# Patient Record
Sex: Female | Born: 1985
Health system: Midwestern US, Academic
[De-identification: ages and names within clinical notes are randomized; demographics above are authoritative.]

---

## 2014-06-24 IMAGING — CR LOW_EXM
3 series · 3 of 3 positions shown · non-contrast
Comparison: None

EXAM: Right ankle
INDICATION: Pain, twisted
TECHNIQUE: AP, oblique, lateral

[ankle ap]
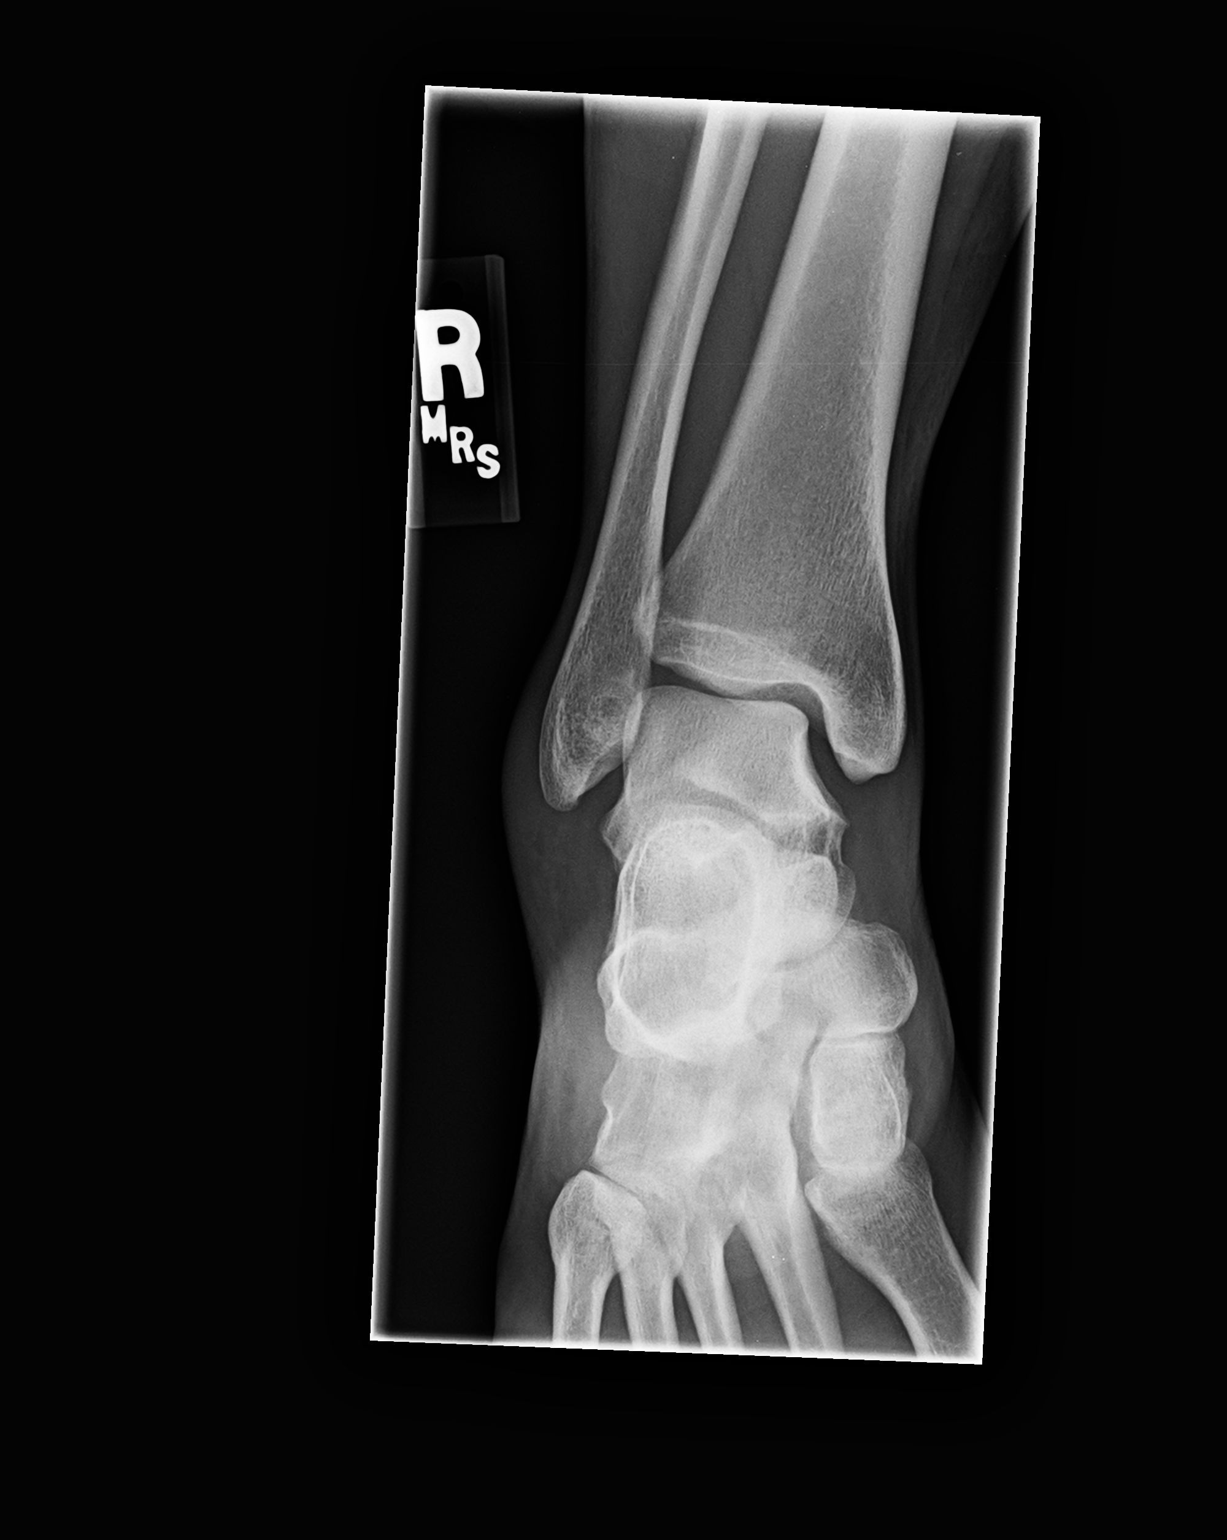

[ankle obl]
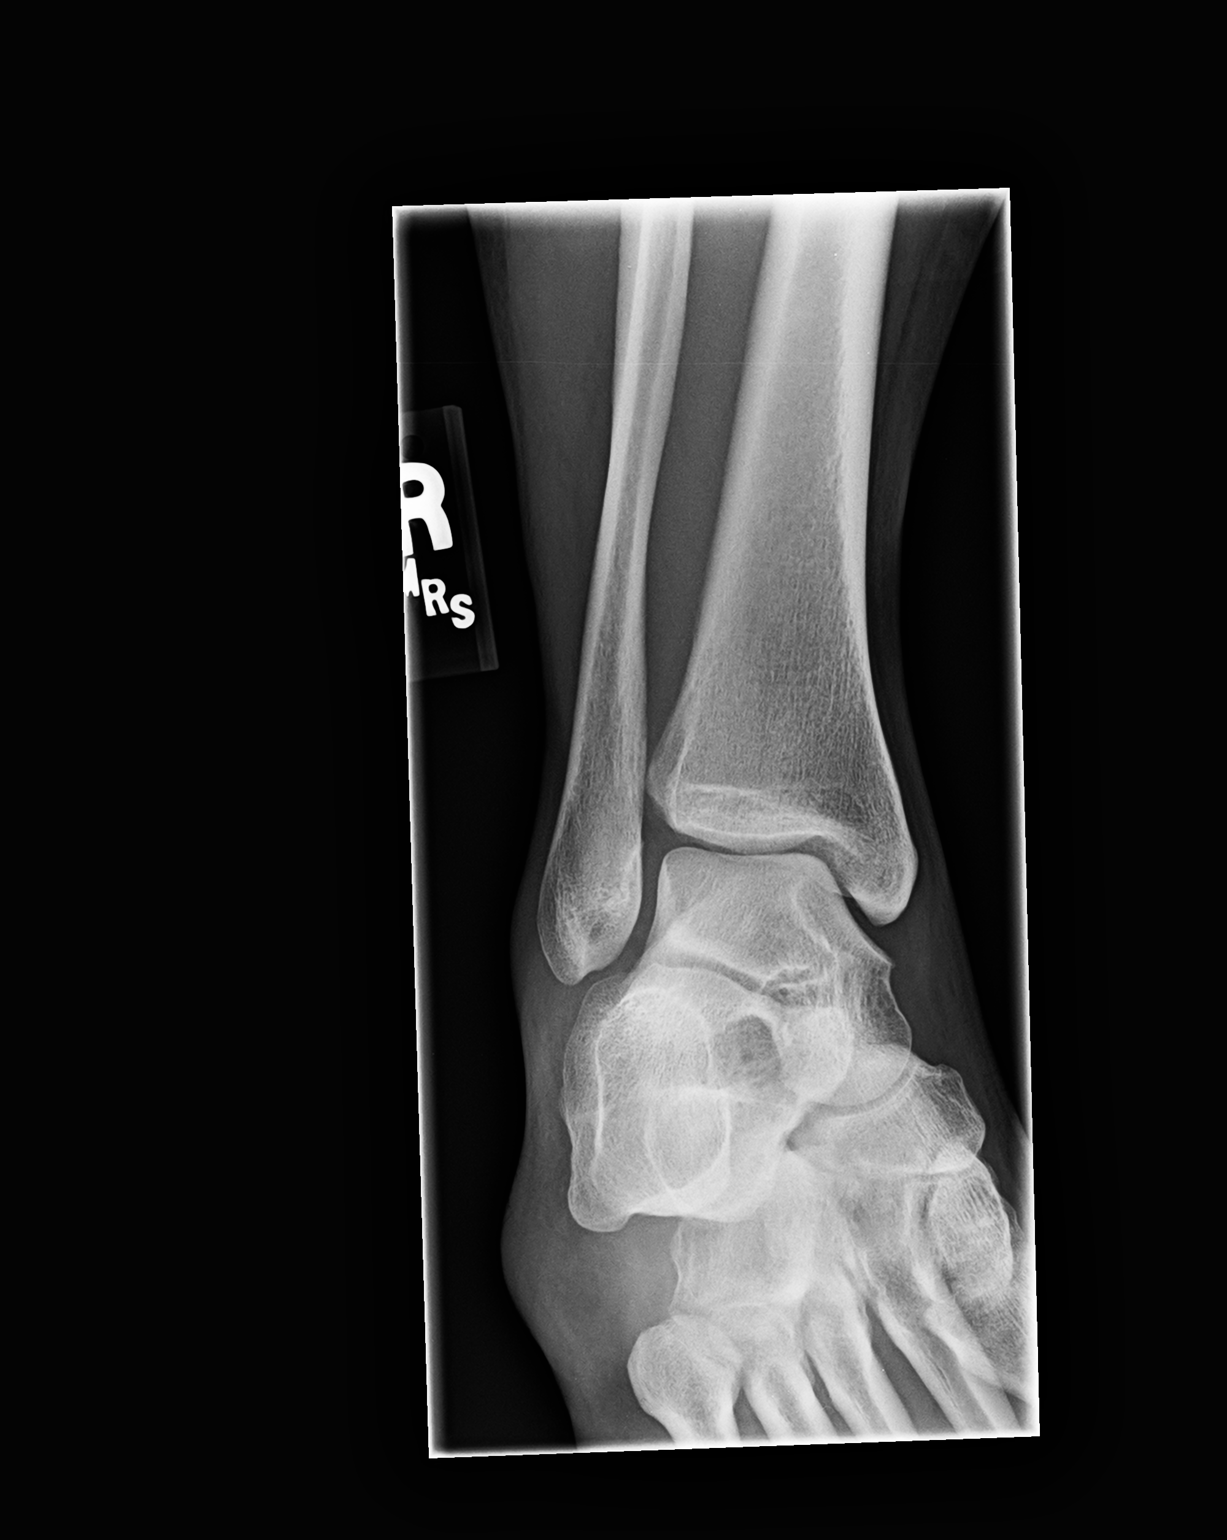

[ankle lat]
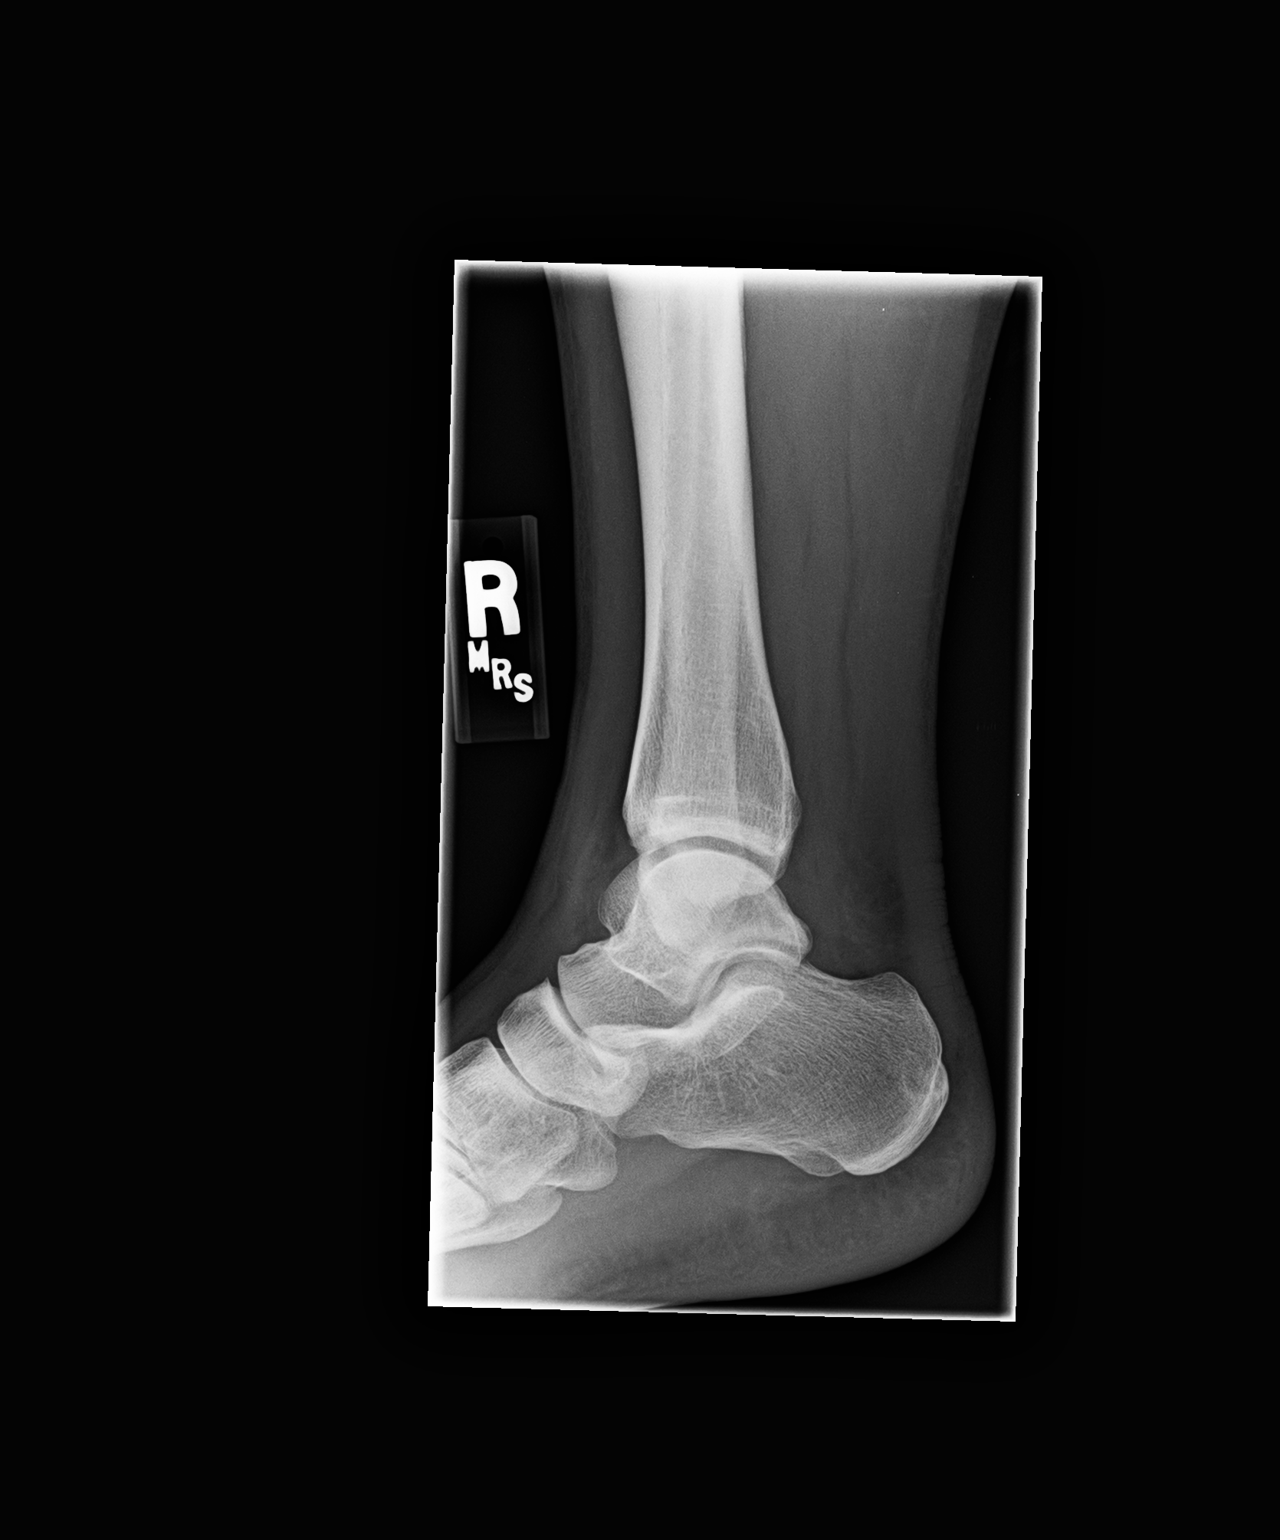

[3 of 3 positions shown; findings below may reference images not displayed]

IMPRESSION: No acute bony abnormality.
FINDINGS: The osseous structures are intact.  No fracture.  Ankle mortis is maintained.
Mild soft tissue swelling over the lateral malleolus.  No laceration or
foreign body.

Comments:  Findings called to Amazigh, Quirijn at [id number] hours.

Tech Notes: TWISTED RT ANKLE LAST NIGHT AND FELT A POP IN ANKLE. PAIN/SWELLING RT LATERAL ANKLE AND
FOOT. SHIELDED. ME

## 2014-06-24 IMAGING — CR LOW_EXM
3 series · 3 of 3 positions shown · non-contrast
Comparison: None

EXAM: Right foot
INDICATION: Pain, twist
TECHNIQUE: AP, oblique, lateral

[foot]
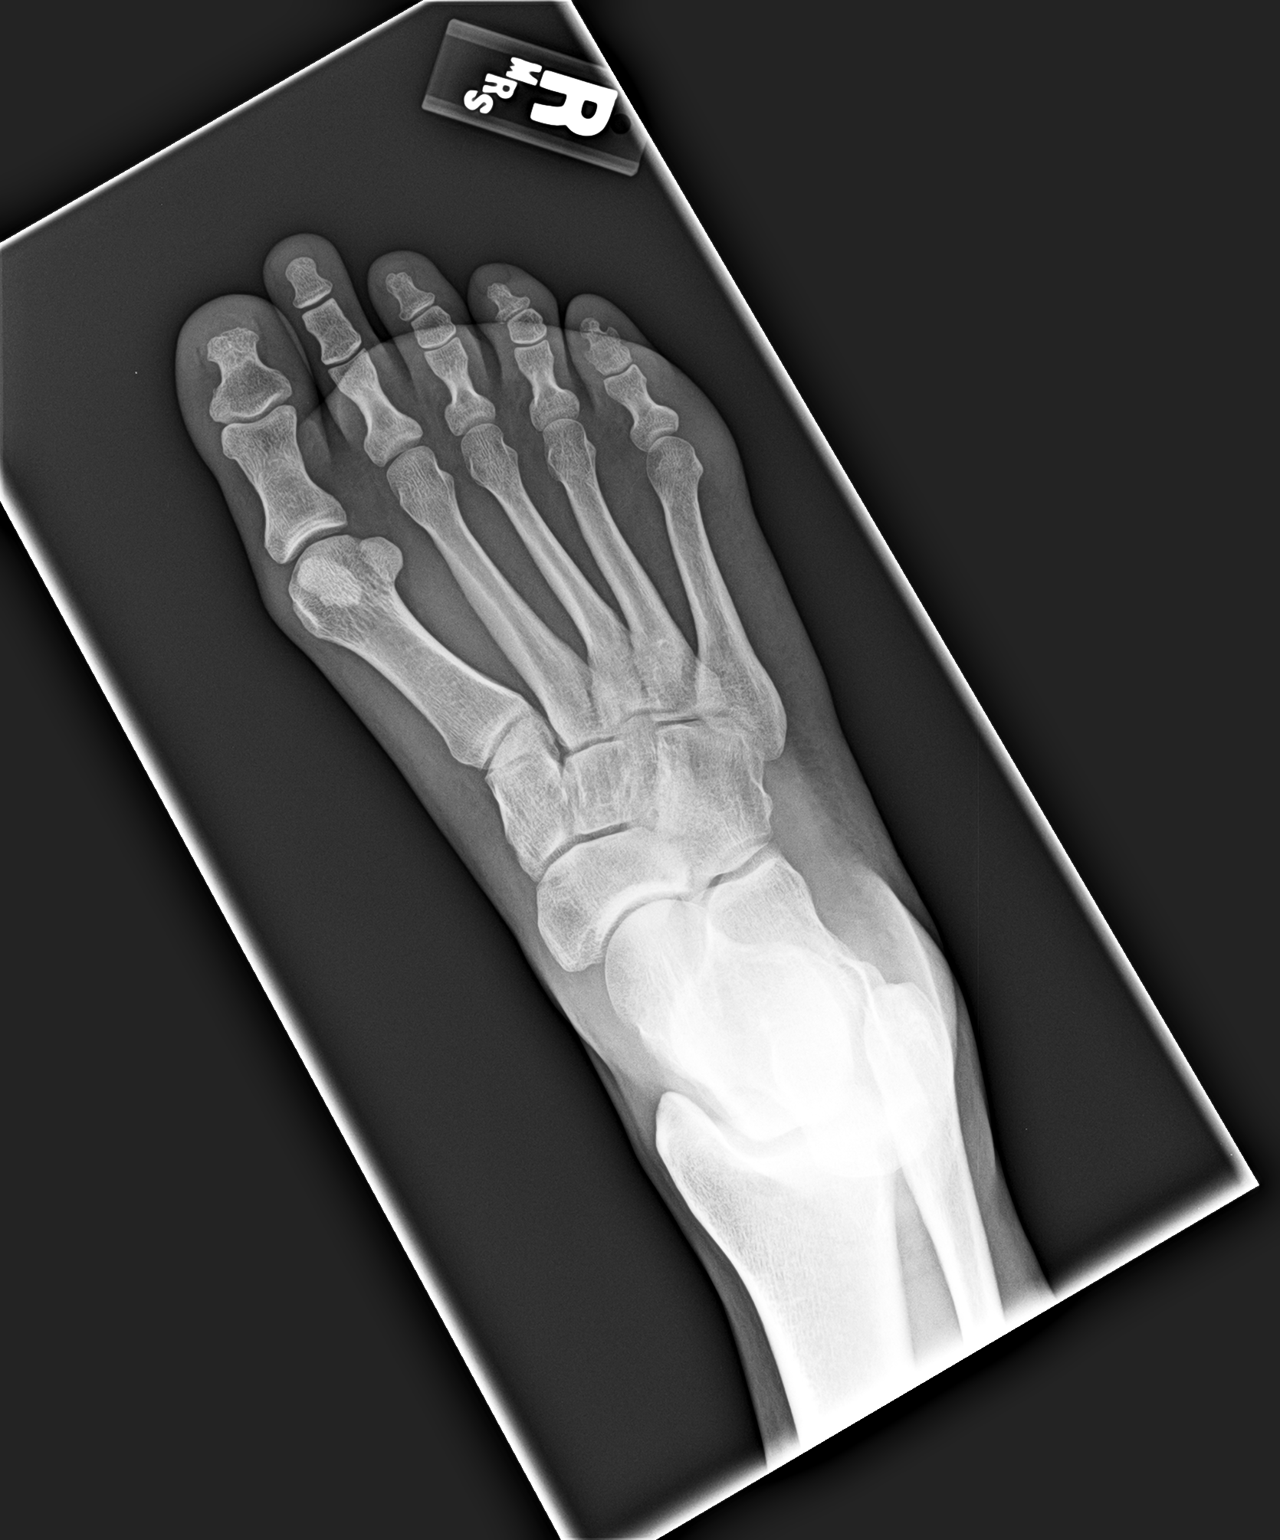

[foot obl]
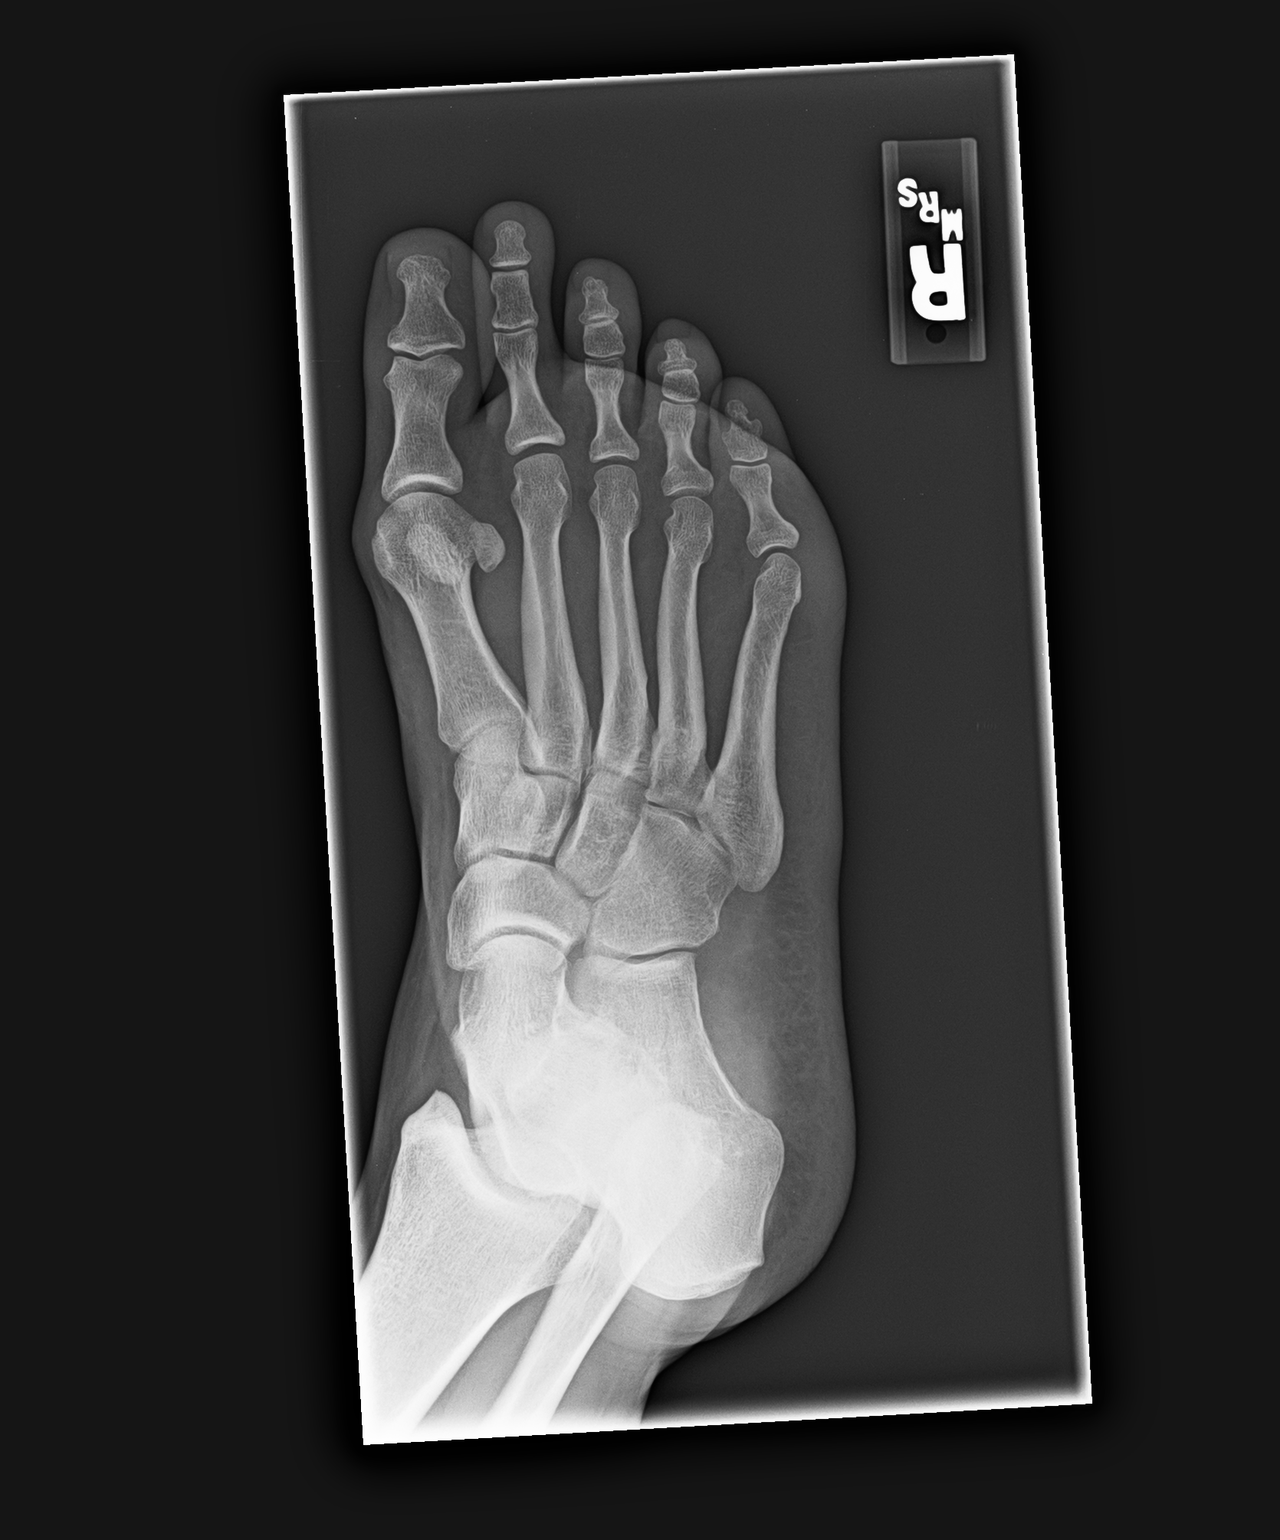

[foot lat]
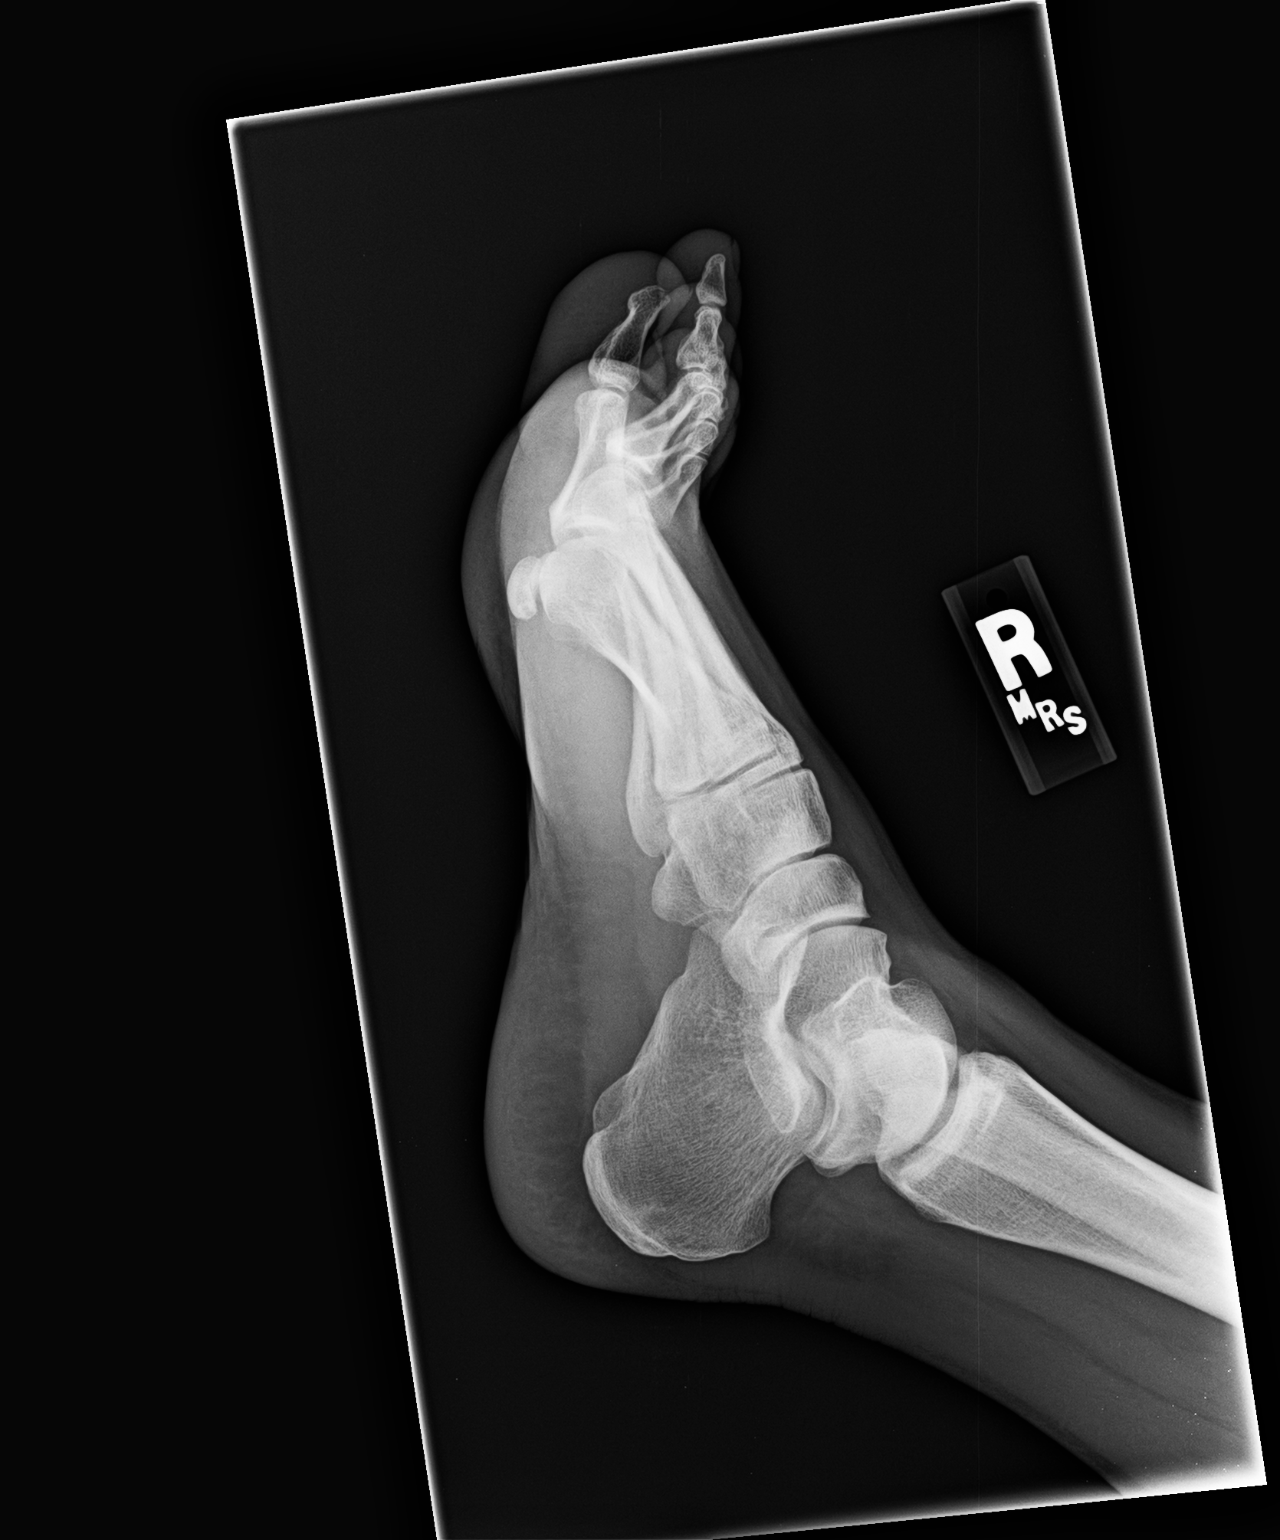

[3 of 3 positions shown; findings below may reference images not displayed]

IMPRESSION: No acute bony abnormality.
FINDINGS: There is normal alignment of the tarsals and metatarsals.  The joint spaces
are maintained.  Soft tissues unremarkable.  No laceration or foreign body.

Comments:  Findings called to Nikita, Rakik in the ER at 2500 hours.

Tech Notes: TWISTED RT ANKLE LAST NIGHT AND FELT A POP IN ANKLE. PAIN/SWELLING RT LATERAL ANKLE AND
FOOT. SHIELDED. ME

## 2017-06-15 IMAGING — CR NECK
5 series · 5 of 5 positions shown · non-contrast
Comparison: none

[c-spine lat (1 of 2)]
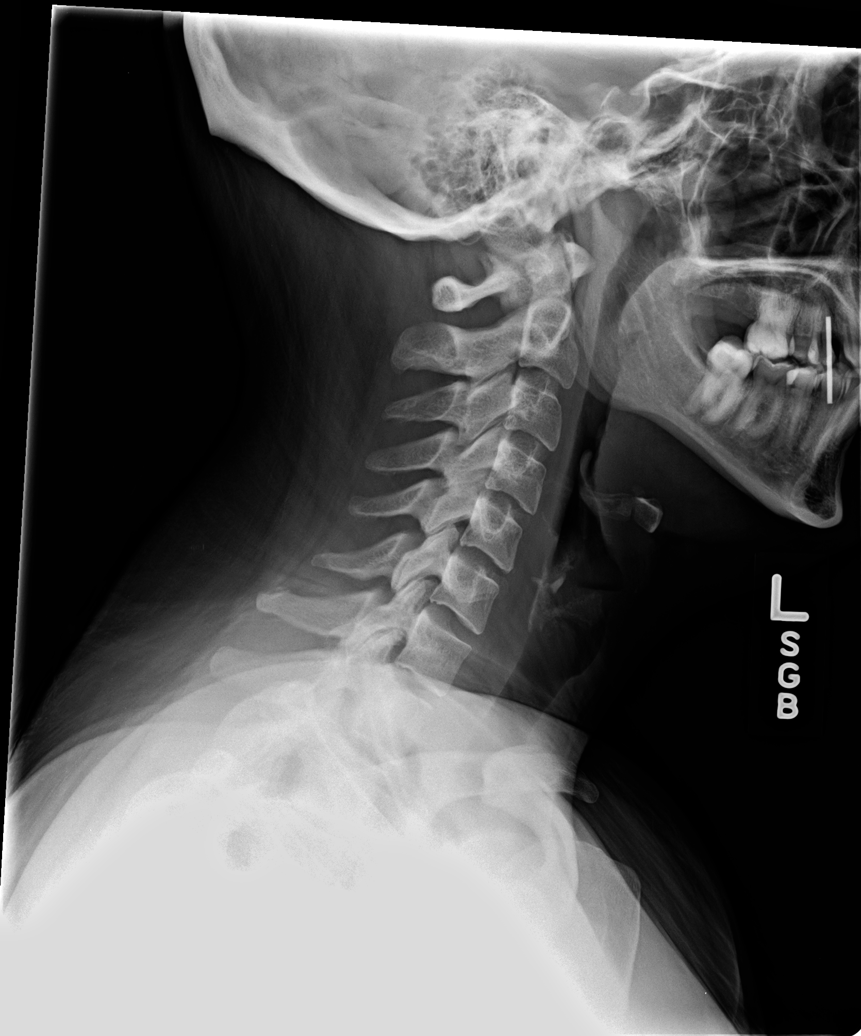

[c-spine ap]
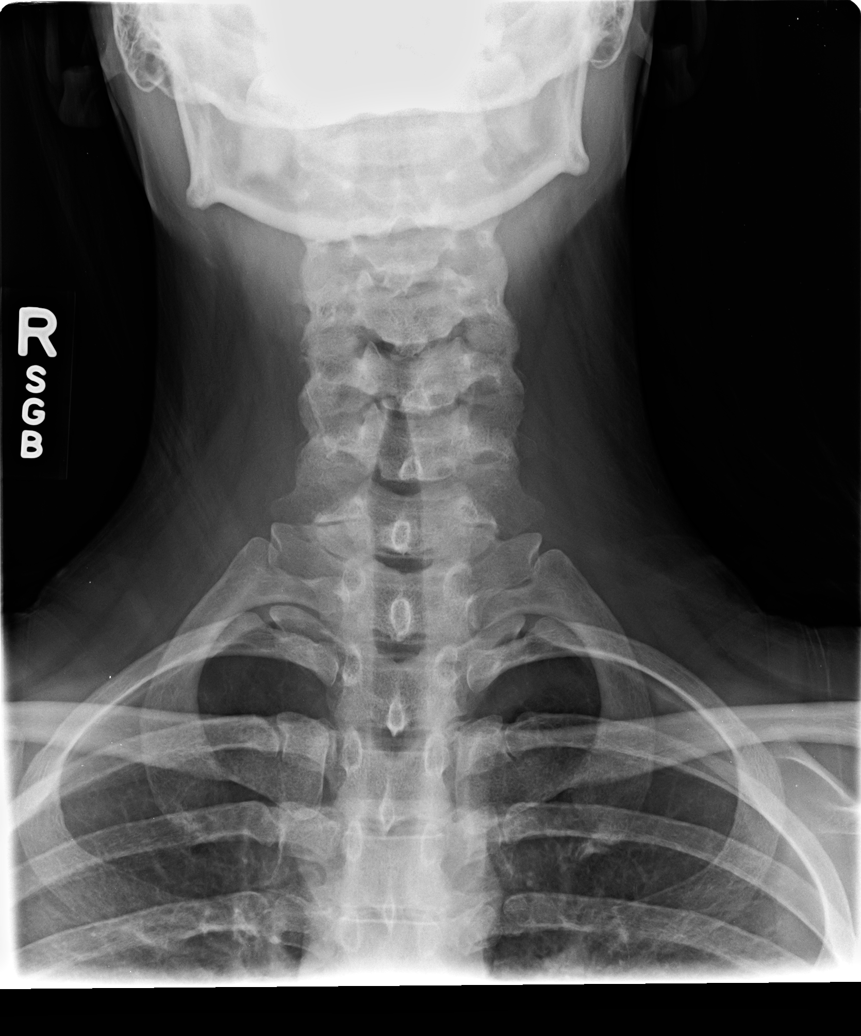

[c-spine obl]
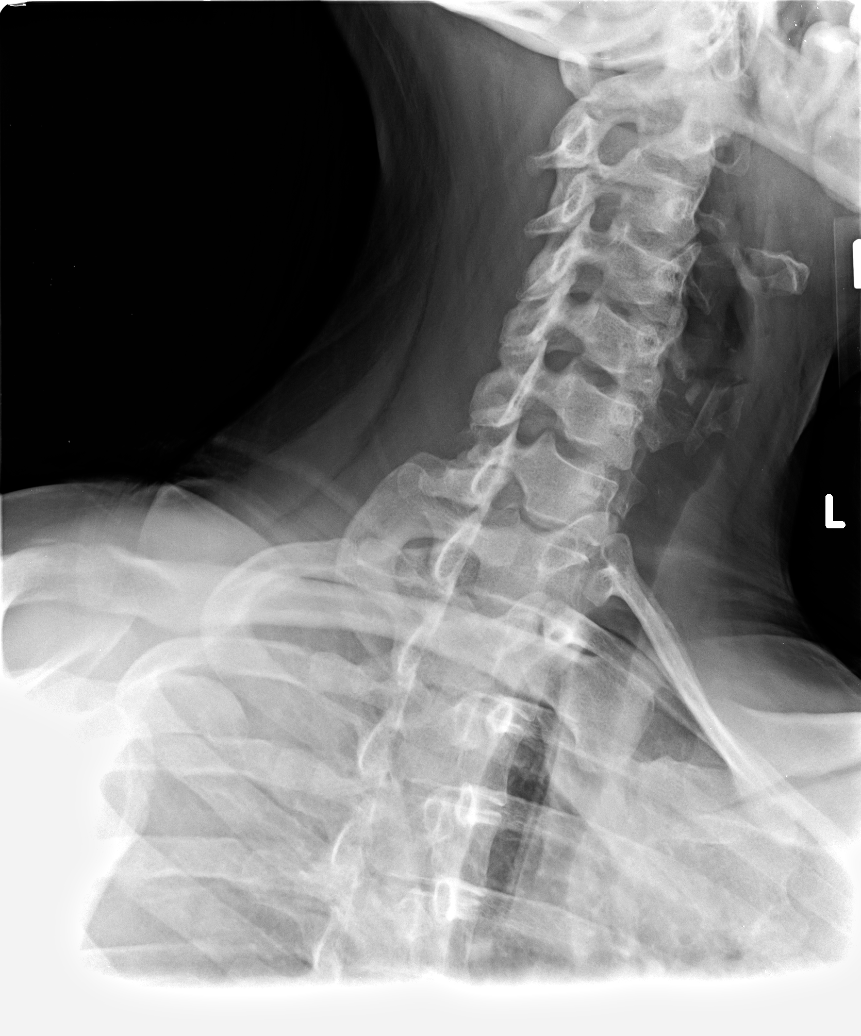

[c-spine lat (2 of 2)]
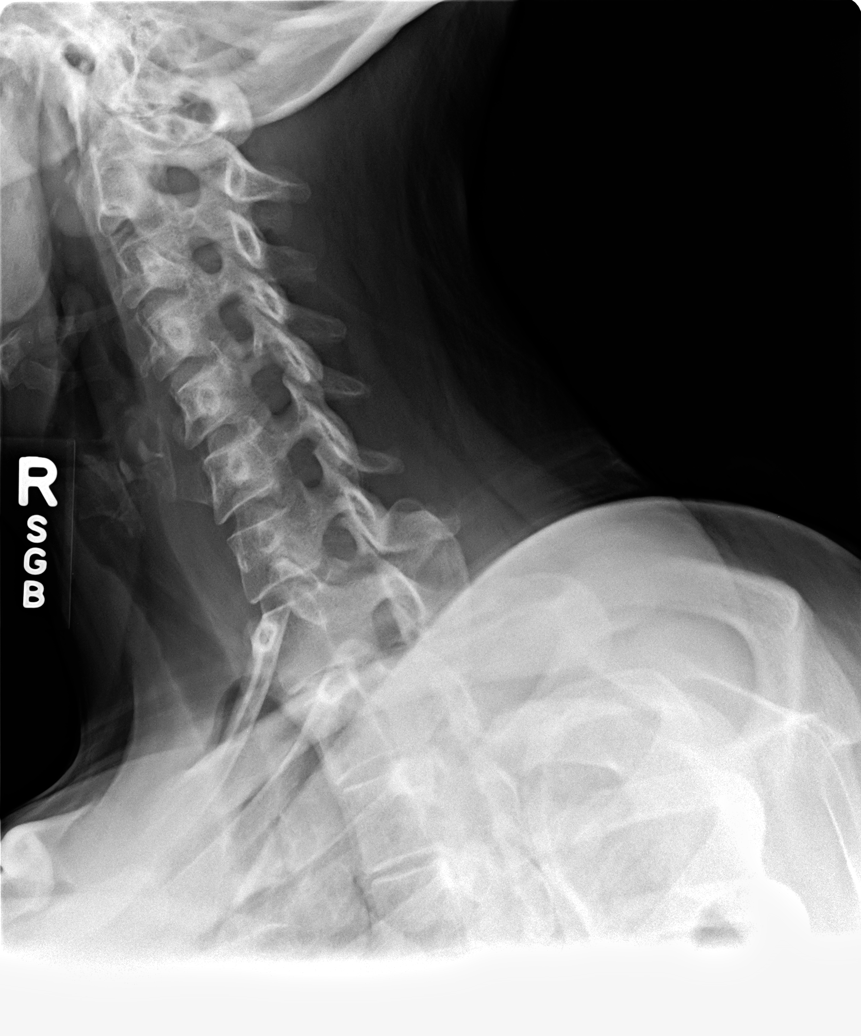

[odontoid]
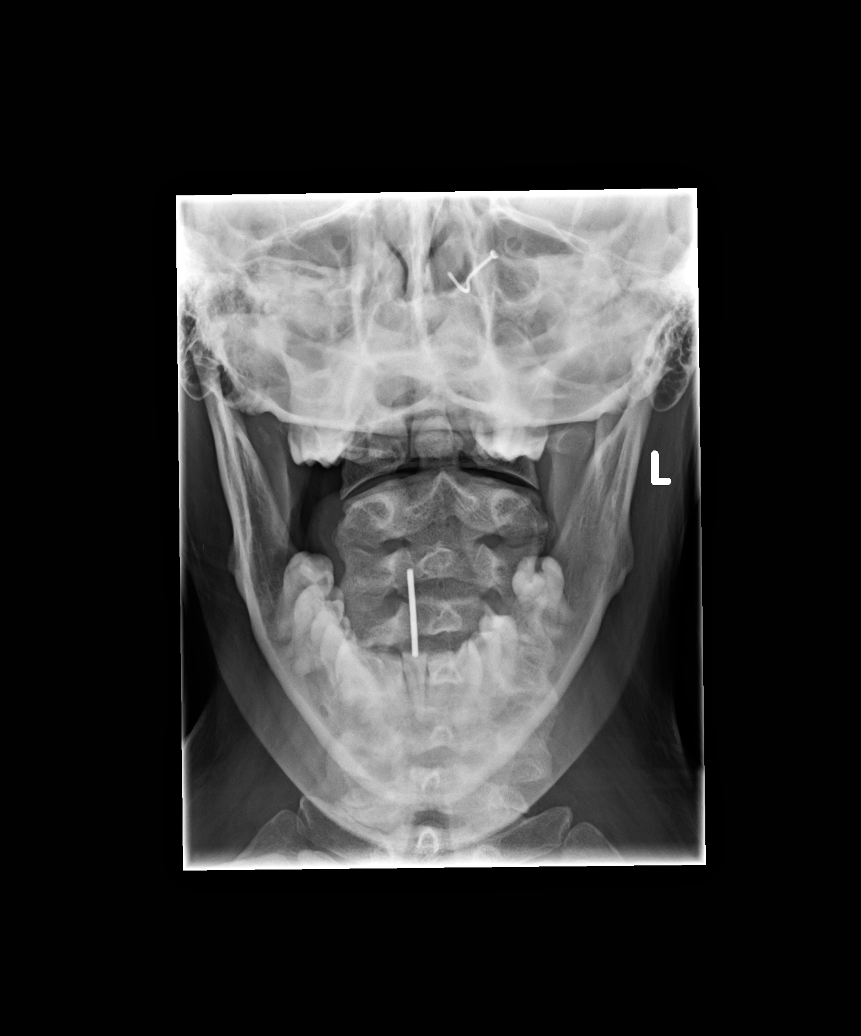

[5 of 5 positions shown; findings below may reference images not displayed]

DIAGNOSTIC STUDIES

EXAM

Cervical spine series.

INDICATION

RT ARM PAIN AND NUMBNESS, NECK PAIN
PT STATES NECK PAIN THAT RADIATES INTO RT ARM W/ NO KNOWN CAUSE. PAIN
RADIATING DOWN RT ARM IS SEVERE ENOUGHT THAT PT STATES IT IS CAUSING SLIGHT
DECREASE ROM. DENIED PREGNANCY. SHIELDED. SB/MT

TECHNIQUE

Lateral, AP, oblique, and odontoid cervical spine views.

COMPARISONS

None.

FINDINGS

The vertebral body height and alignment is maintained. There is no fracture. No evidence of neural
foraminal stenosis on oblique projections. The atlantoaxial alignment is normal. Tongue piercing is
incidentally noted.

IMPRESSION

No acute osseous abnormality or significant disc degeneration.

## 2017-06-15 IMAGING — CR UP_EXM
4 series · 4 of 4 positions shown · non-contrast
Comparison: none

[elbow ap]
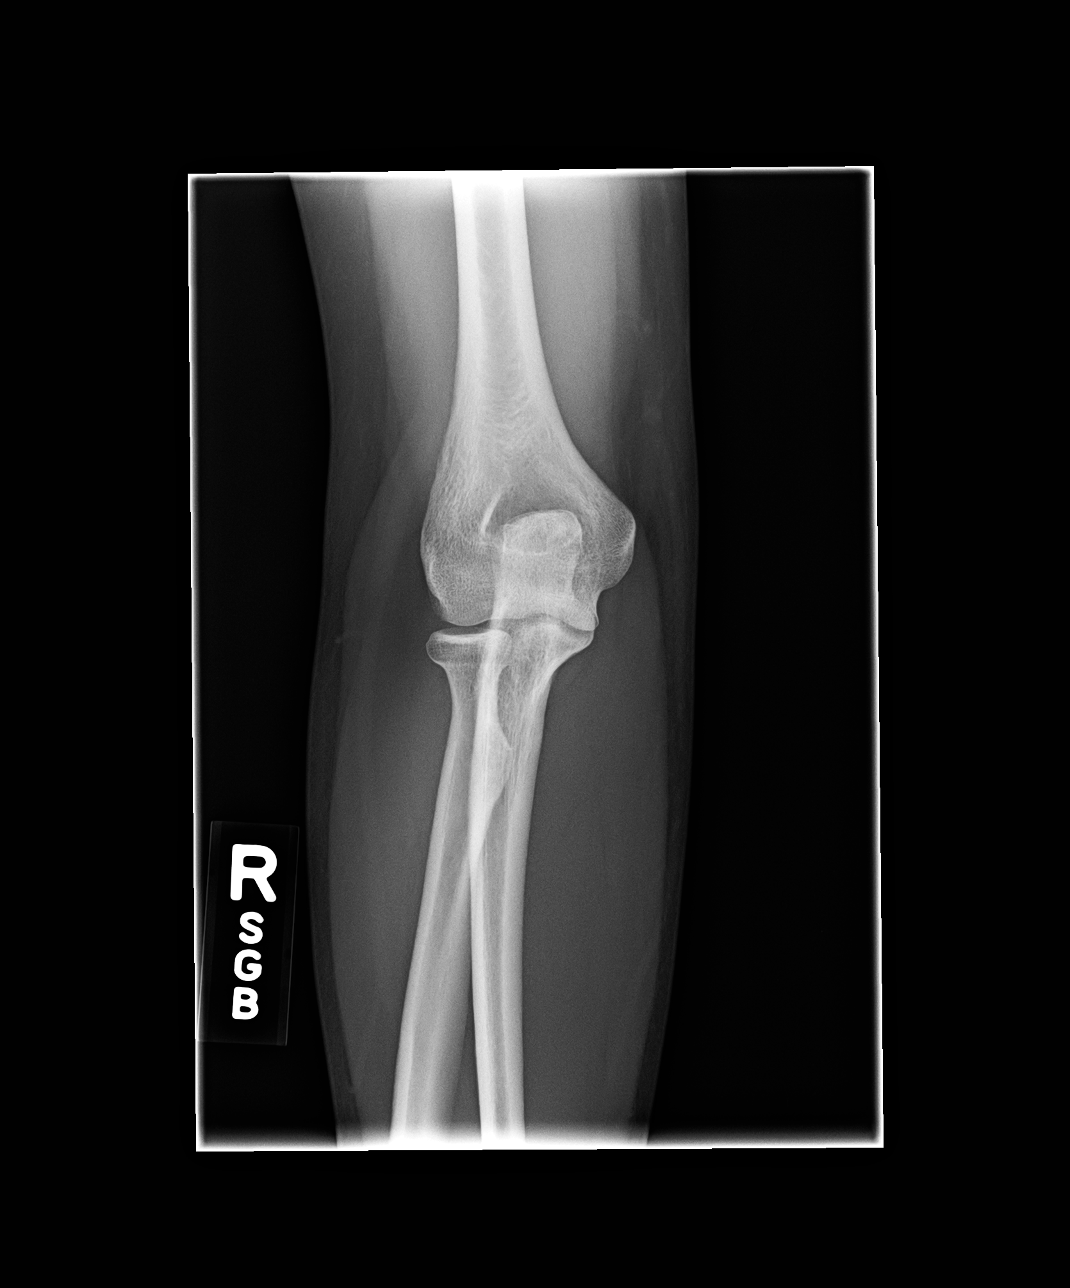

[elbow obl (1 of 2)]
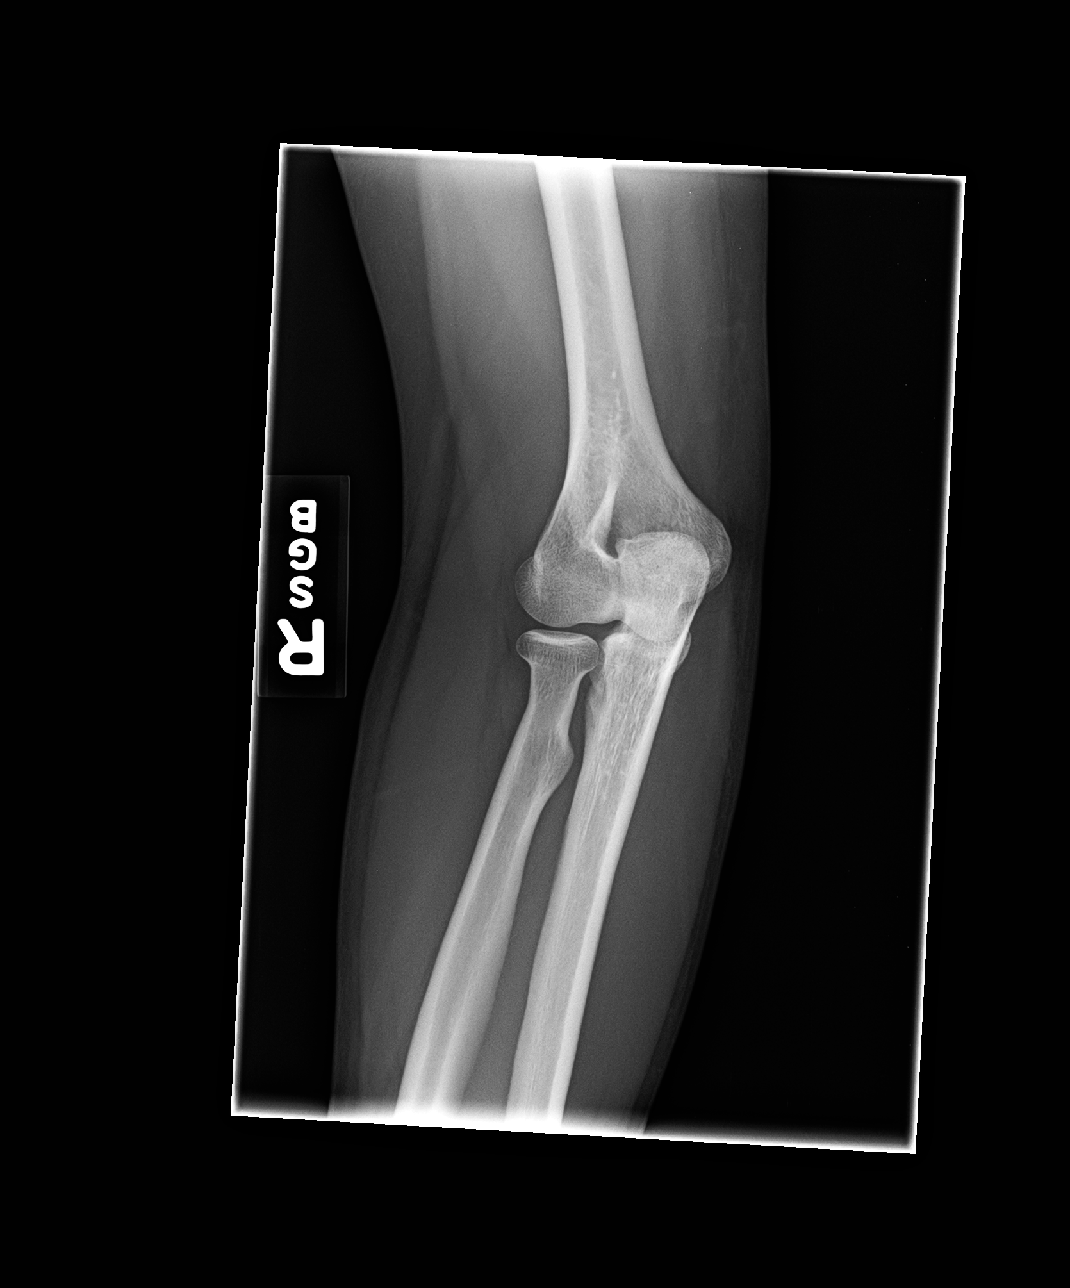

[elbow obl (2 of 2)]
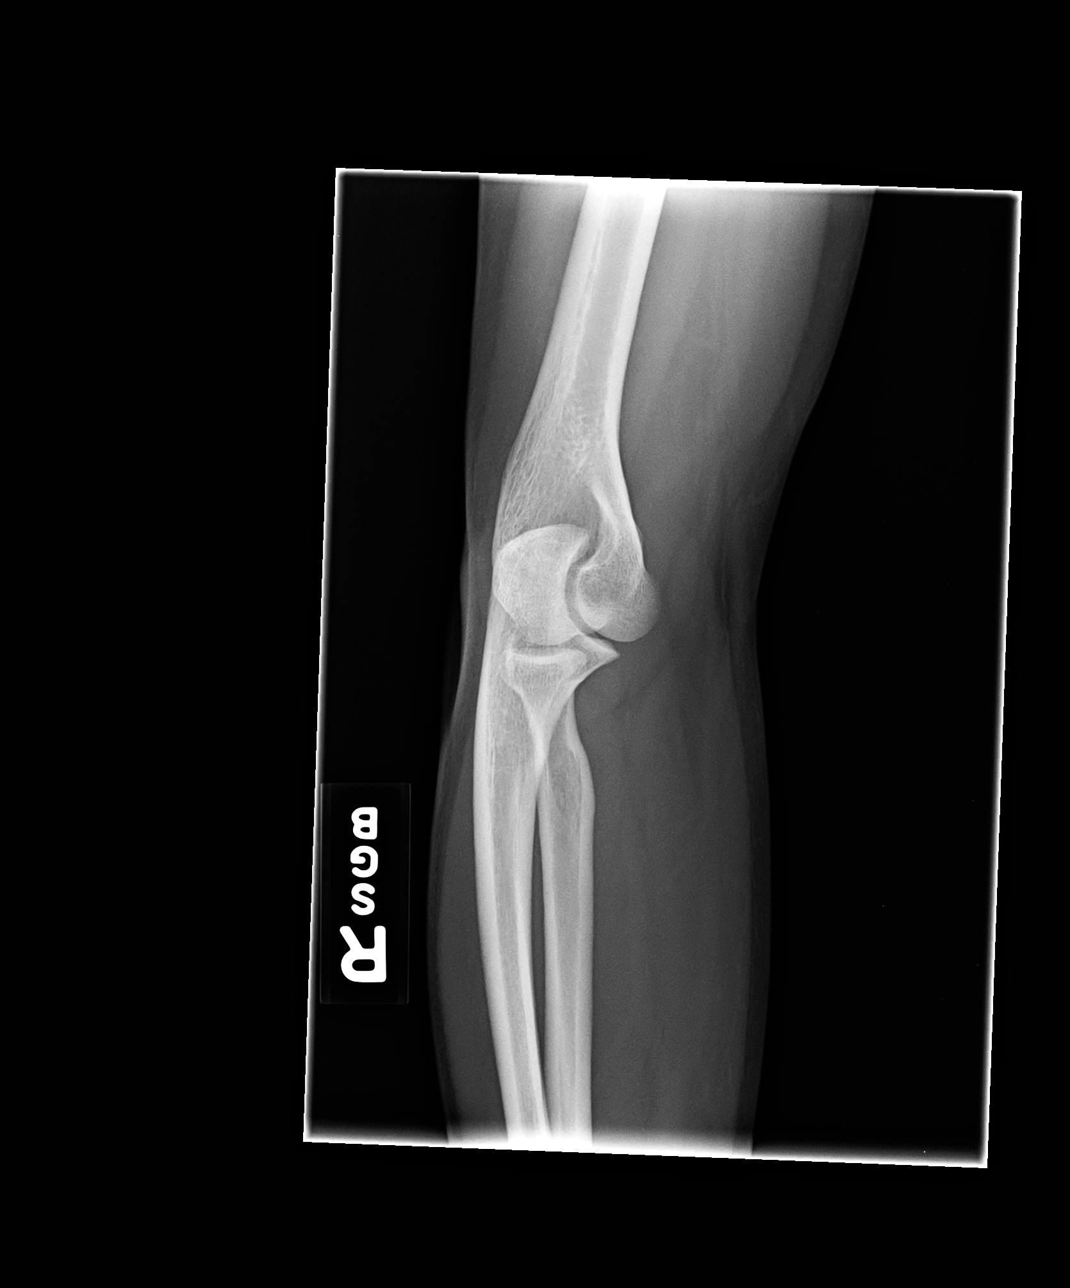

[elbow lat]
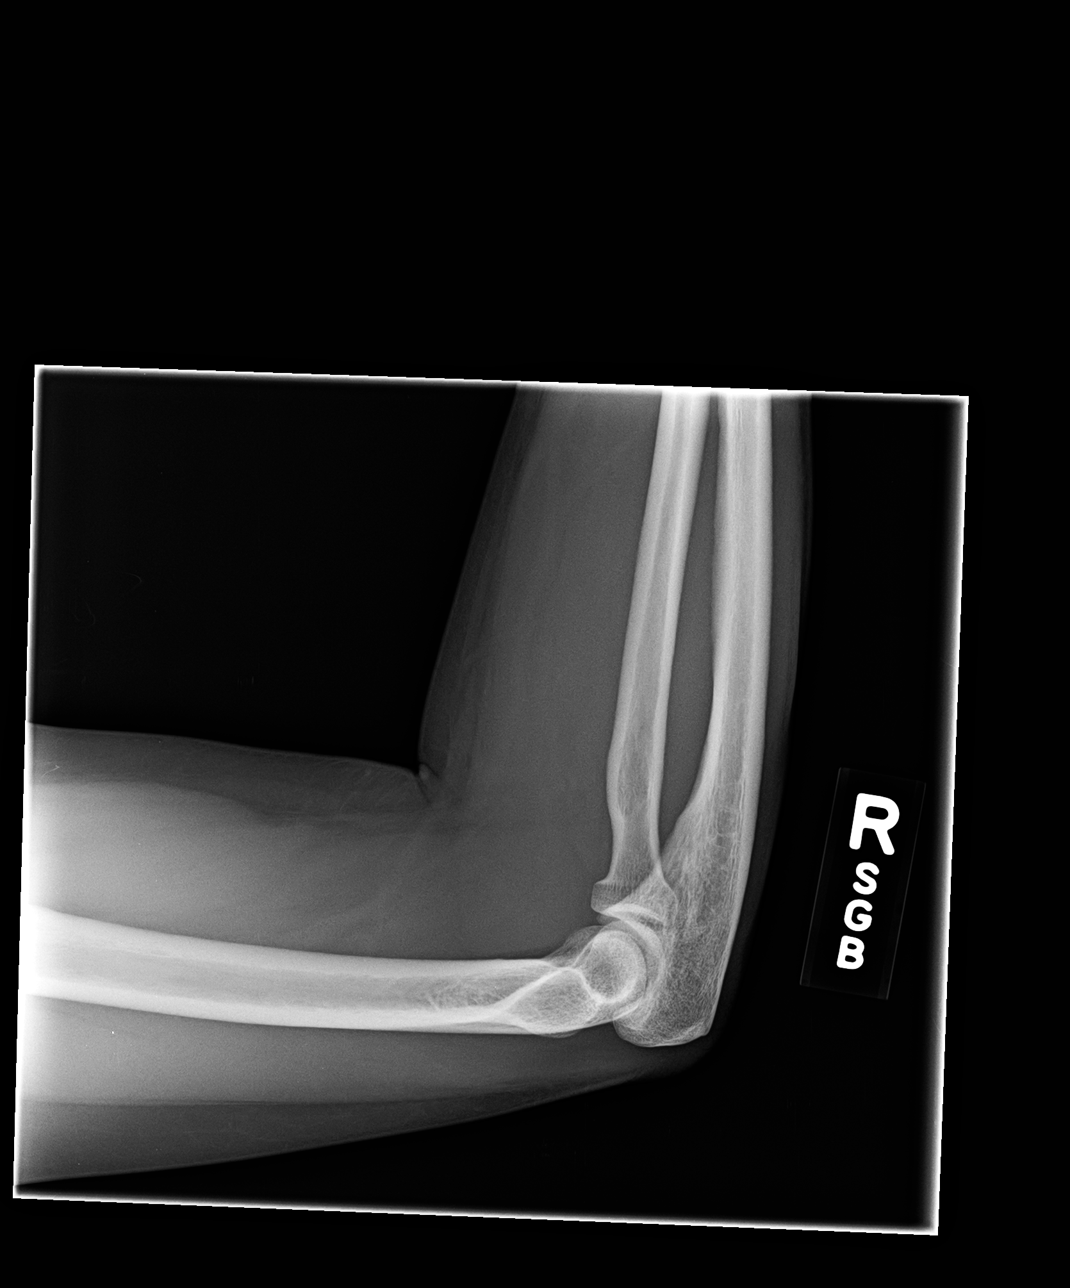

[4 of 4 positions shown; findings below may reference images not displayed]

DIAGNOSTIC STUDIES

EXAM

4 VIEW RIGHT ELBOW

INDICATION

Right elbow pain.

TECHNIQUE

4 views of the right elbow were submitted.

COMPARISONS

None

FINDINGS

There is no elbow joint effusion. No displaced fracture or malalignment is detected. There is no
joint space loss. No destructive bone lesion is identified.

IMPRESSION

No acute bony abnormality and no joint effusion.

## 2017-06-17 IMAGING — CT Spine^1_C_SPINE (Adult)
1 series · 12 of 14 positions shown, 15 images · non-contrast
Comparison: none

[Series 5: c-spine 1.5 bone · axial · 0.33mm/px · z∈[+98,+241]mm · 12 of 112 slices shown, 15 images]
[im 9/112  soft-tissue]
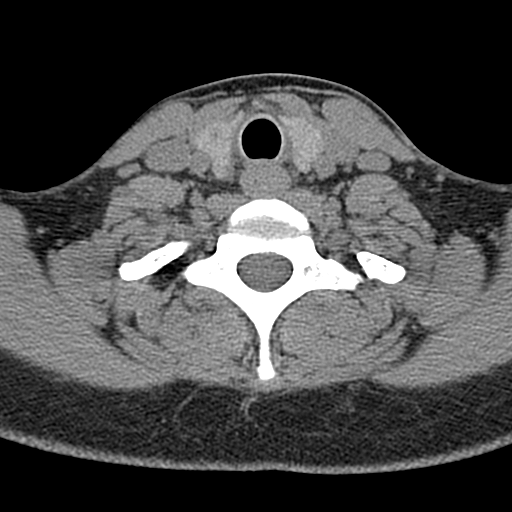
[im 9/112  bone]
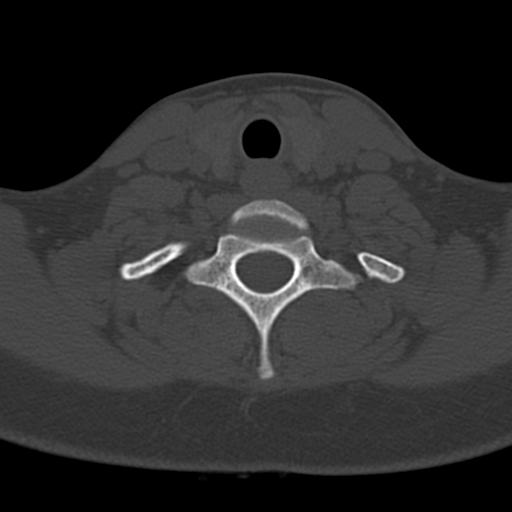
[im 18/112  bone]
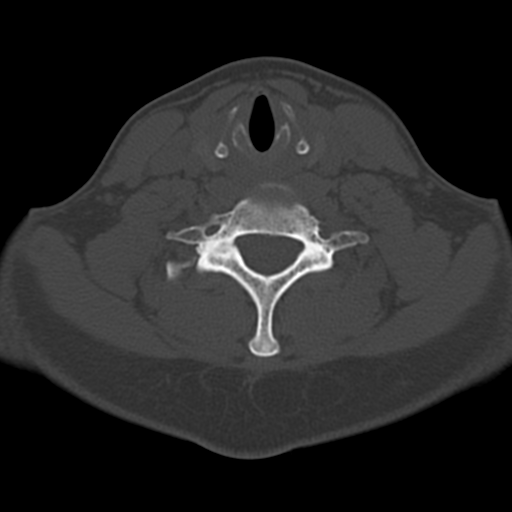
[im 26/112  bone]
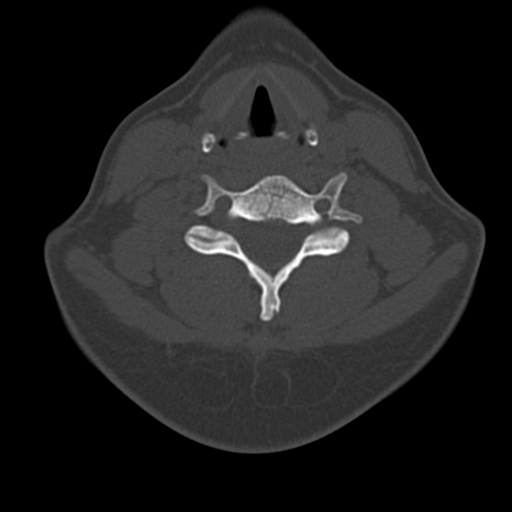
[im 35/112  bone]
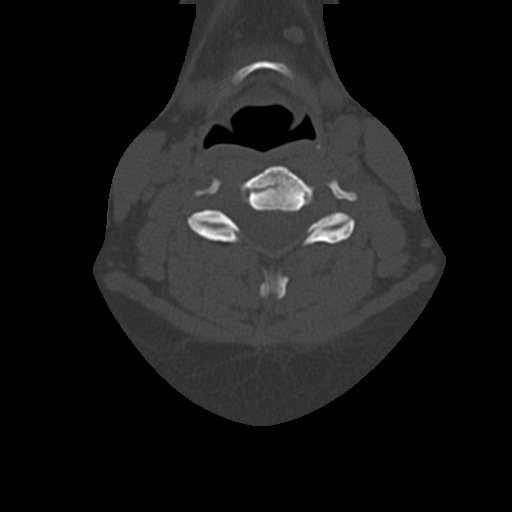
[im 43/112  soft-tissue]
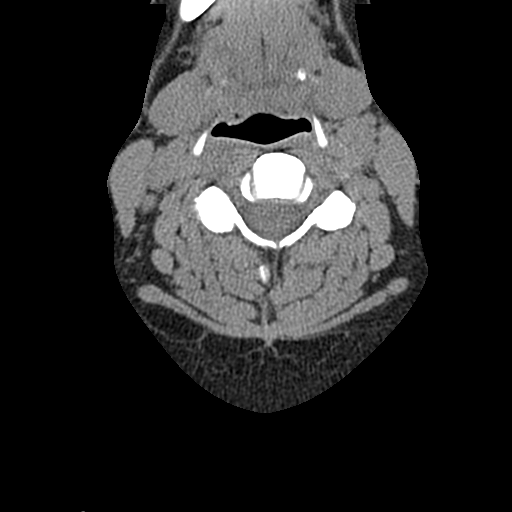
[im 43/112  bone]
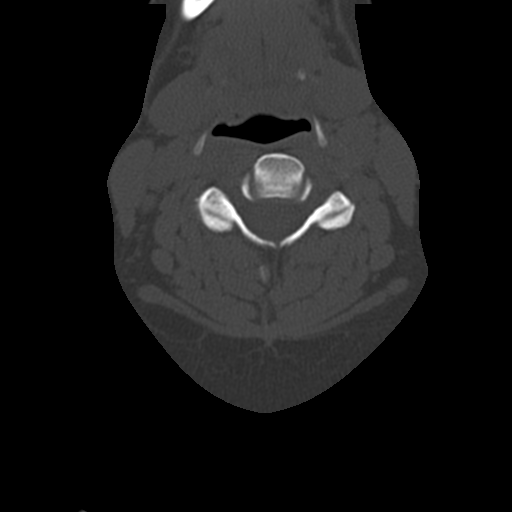
[im 52/112  bone]
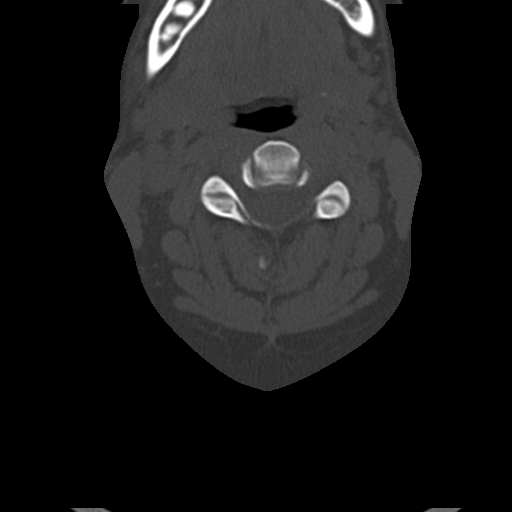
[im 60/112  bone]
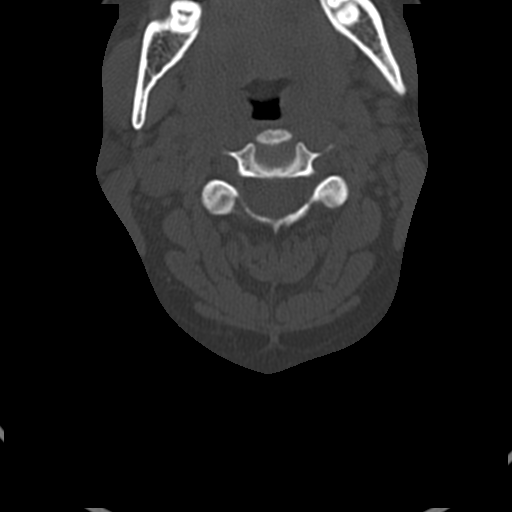
[im 69/112  bone]
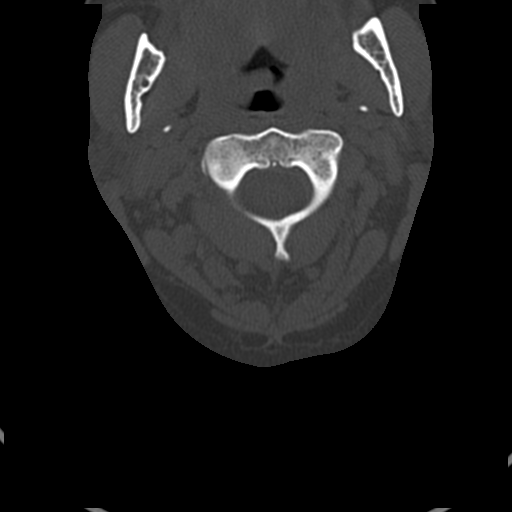
[im 77/112  soft-tissue]
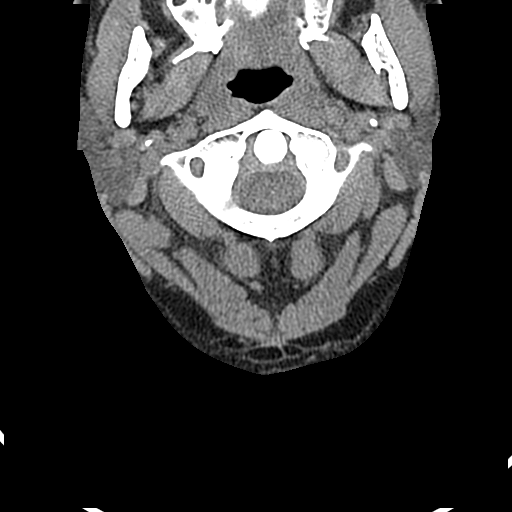
[im 77/112  bone]
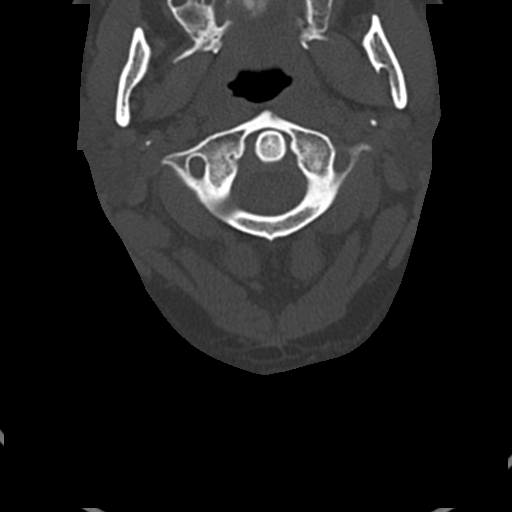
[im 86/112  bone]
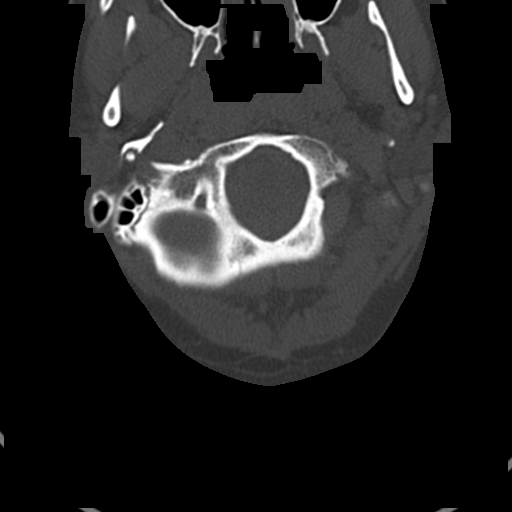
[im 94/112  bone]
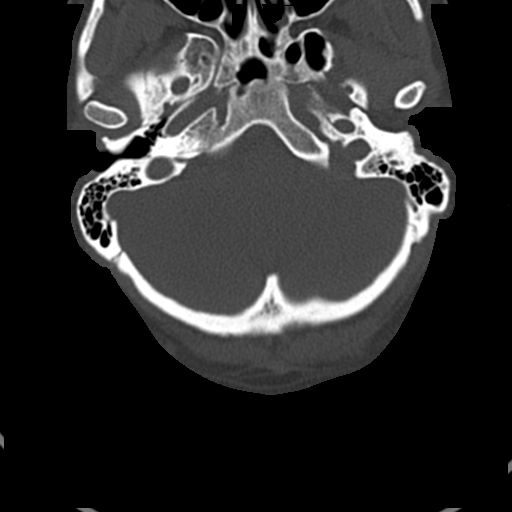
[im 103/112  bone]
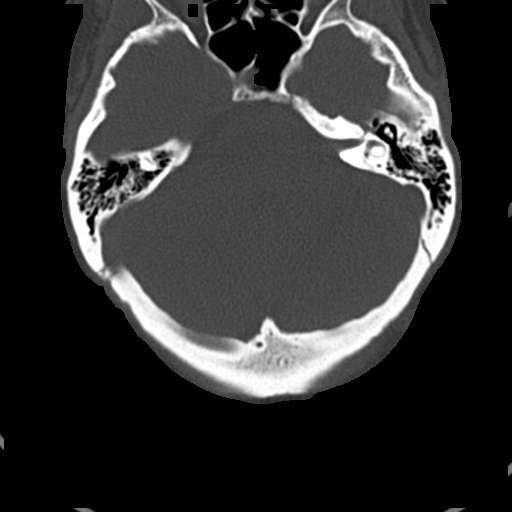

[12 of 14 positions shown; findings below may reference images not displayed]

EXAM

COMPUTED TOMOGRAPHY, CERVICAL SPINE; WITHOUT CONTRAST MATERIAL CPT 09794

INDICATION

severe pain right arm
PT STATES SEVERE NECK PAIN THAT RADIATES INTO RT ARM. NO KNOWN CAUSE.
DENIED PREGNANY. SB

TECHNIQUE

Non contrast CT of the cervical spine was performed. Multiplanar sagittal and coronal reformations
are provided. All CT scans at this facility use dose modulation, iterative reconstruction, and/or
weight based dosing when appropriate to reduce radiation dose to as low as reasonably achievable.

COMPARISONS

None

FINDINGS

No evidence for acute fracture or subluxation of the cervical spine. However, there is a small
cortical avulsion injury at the superior aspect of the tip of the C7 spinous process with
corticated margins compatible with old injury. Atlanto-dens interval is intact. Prevertebral soft
tissues are normal. AP and lateral alignment is maintained. Vertebral body heights are preserved.
Facets are aligned. No bony impingement on the central canal. No significant spondylosis or facet.

Review of neck soft tissues shows no masses or adenopathy. Paravertebral soft tissues are intact.
Airway is patent. Lung apices are unremarkable.

IMPRESSION

No evidence for acute traumatic injury to the cervical spine.

## 2018-03-09 IMAGING — CR LOW_EXM
3 series · 3 of 3 positions shown · non-contrast
Comparison: none

[foot]
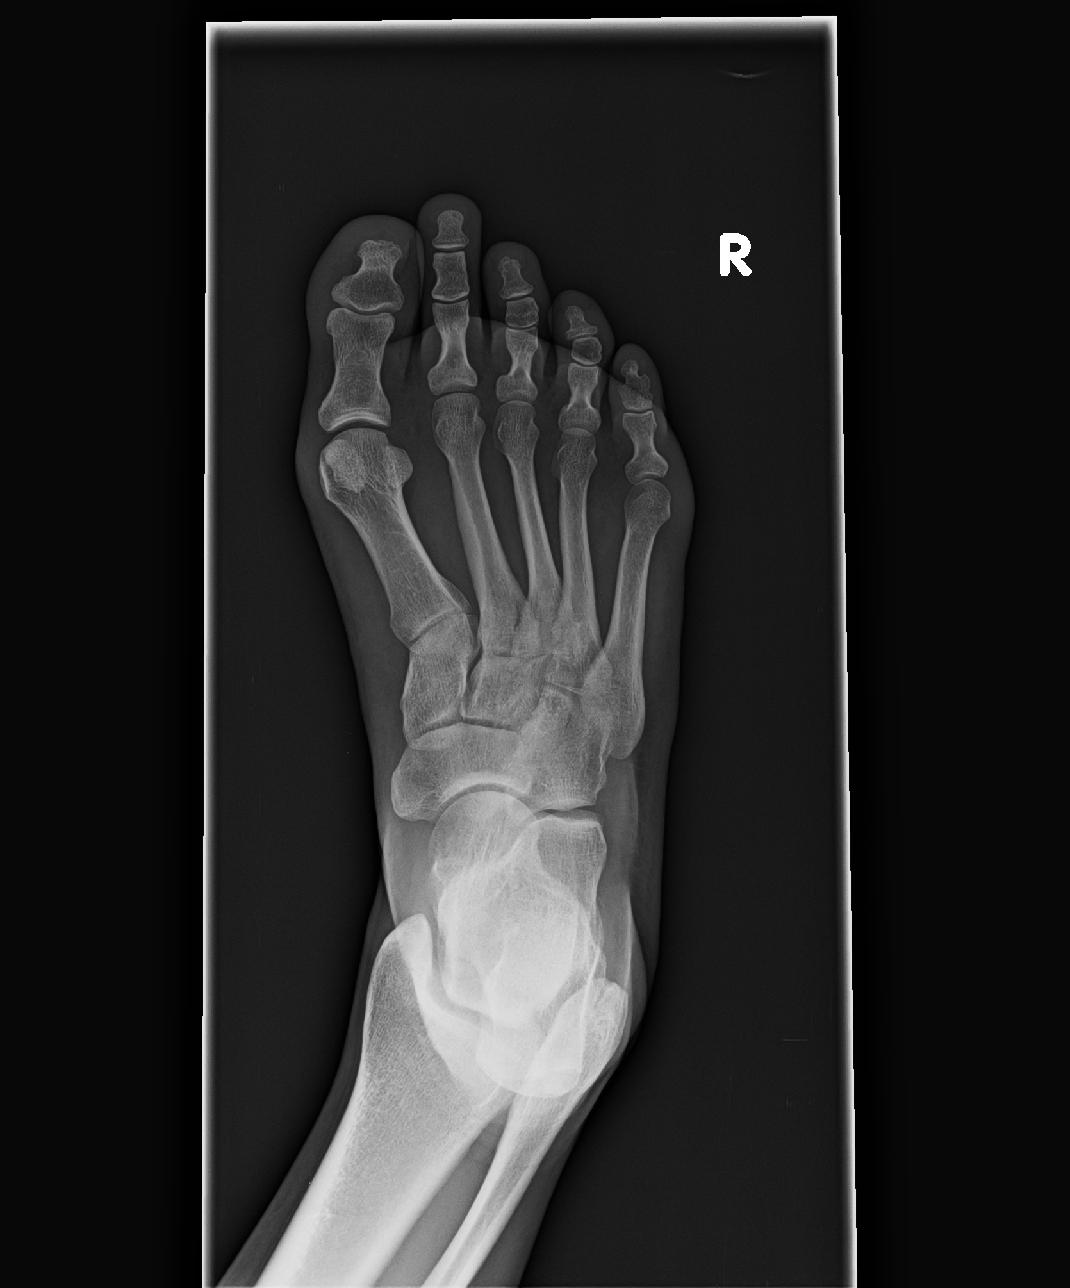

[foot obl]
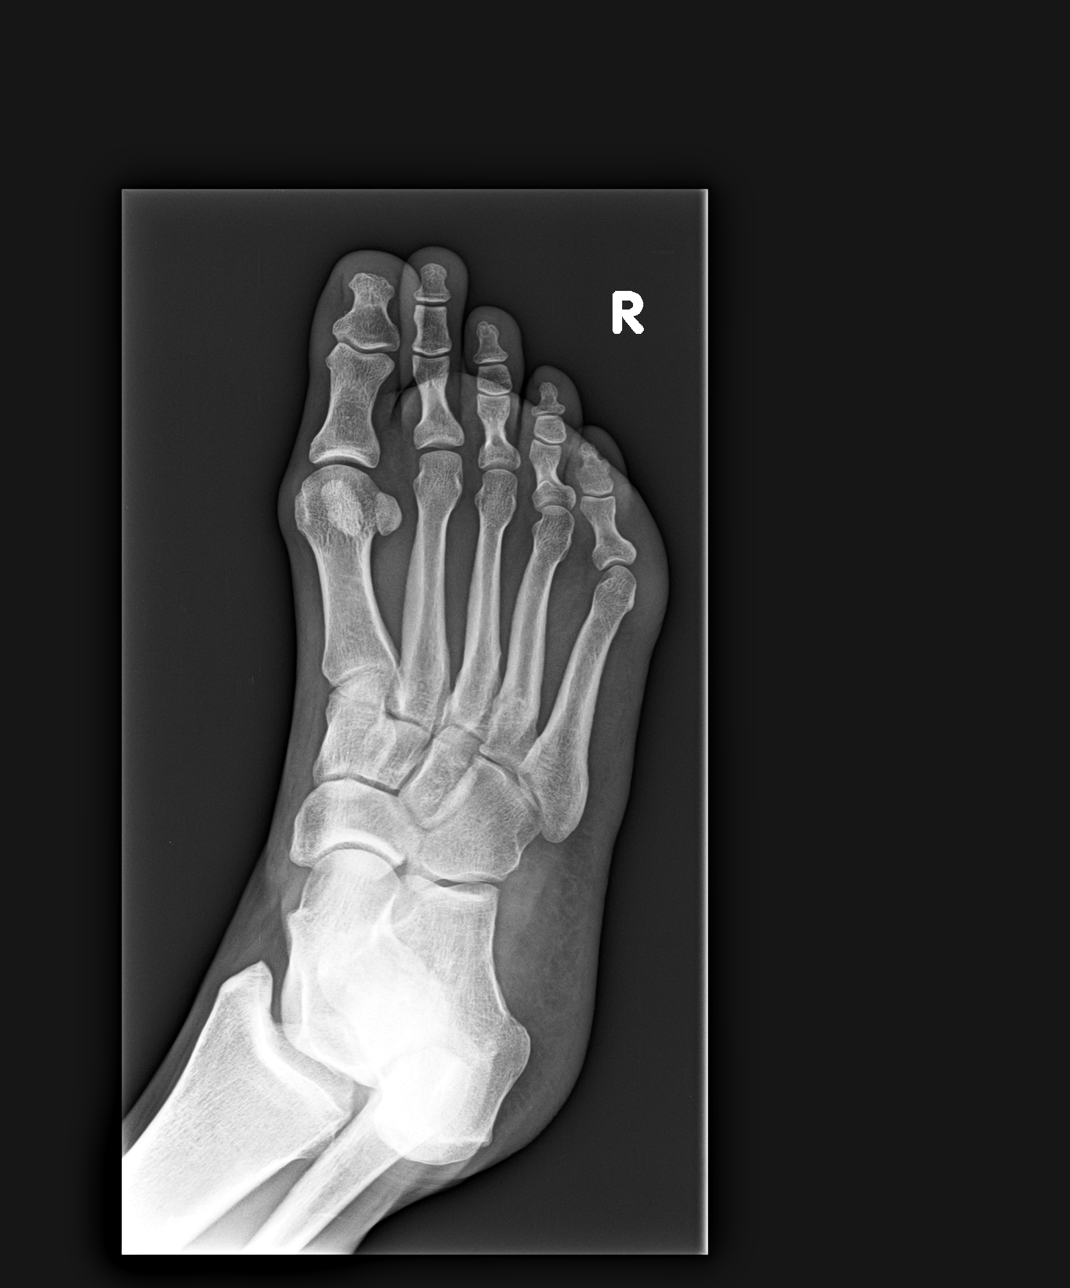

[foot lat]
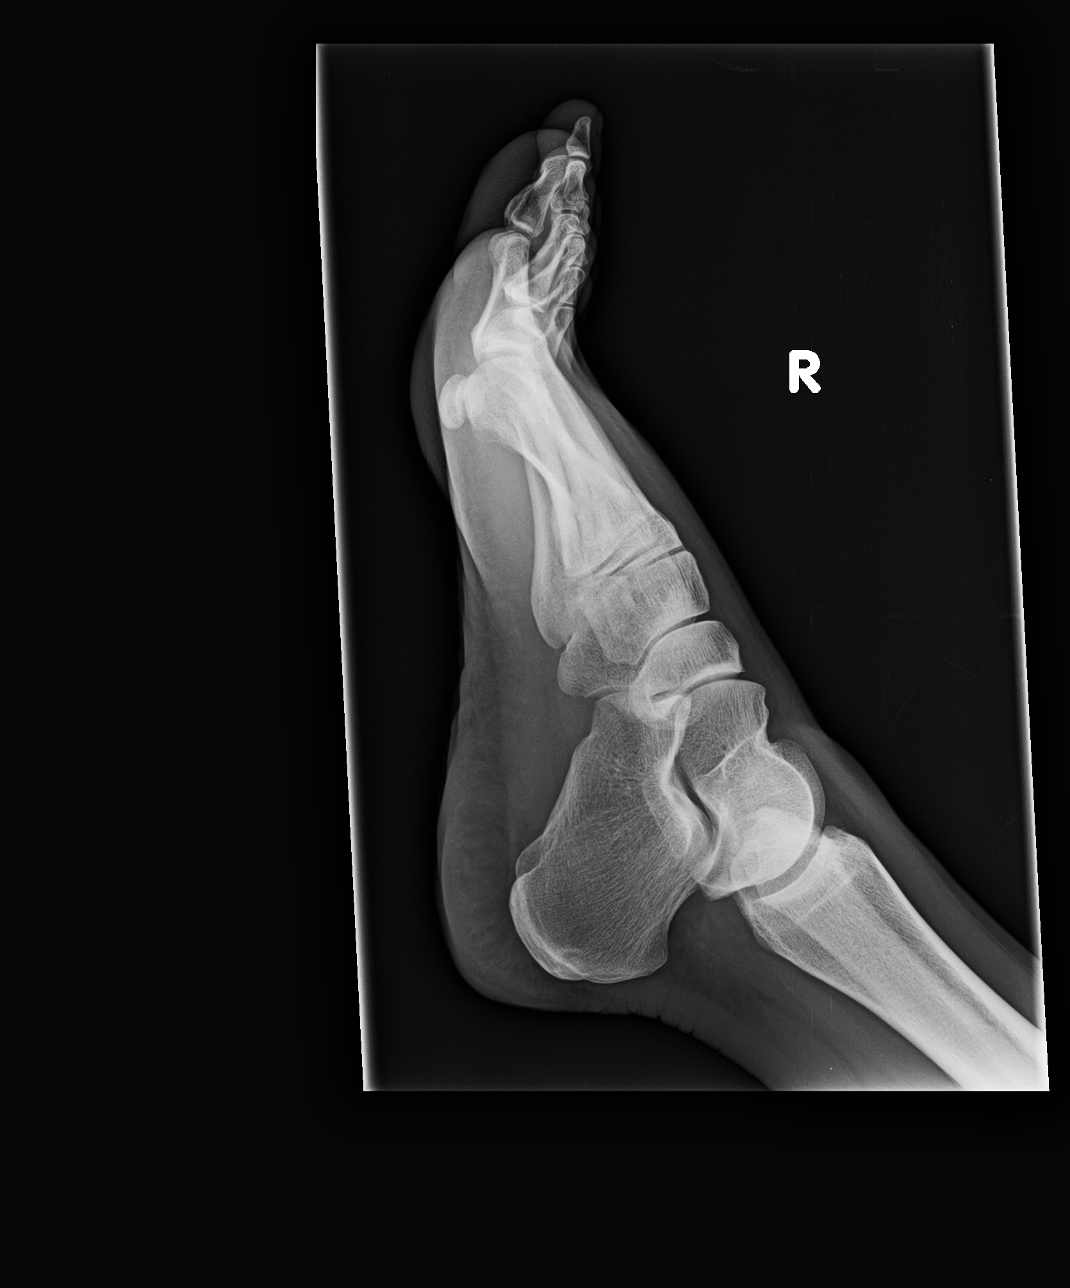

[3 of 3 positions shown; findings below may reference images not displayed]

EXAM
RADIOLOGICAL EXAMINATION, FOOT; COMPLETE
3 OR MORE VIEWS CPT 73237

INDICATION
trauma to mid-foot, dorsal aspect
Trampoline landed on top of foot. Bruising. BG

TECHNIQUE
3 views of the right foot were acquired.

COMPARISONS
There are no previous examinations available for comparison at the time of dictation.

FINDINGS
There are no fractures or subluxations of the right foot. There are no abnormal masses or
calcifications. There are no blastic or lytic lesions.

IMPRESSION
No acute radiographic abnormalities of the right foot.

Tech Notes:

Trampoline landed on top of foot.  Bruising.
BG

## 2018-09-07 IMAGING — CR CHEST
3 series · 3 of 3 positions shown · non-contrast
Comparison: none

PROCEDURE: CHEST
HISTORY: TWISTED ARM WHEN SHE FELL. LANDED WITH ARM CAUGHT BEHIND/UNDER HER. C/O
LT SHOULDER PAIN THAT RADIATES TO NECK. DENIES PREGNANCY. SHIELDED.

[shoulder external]
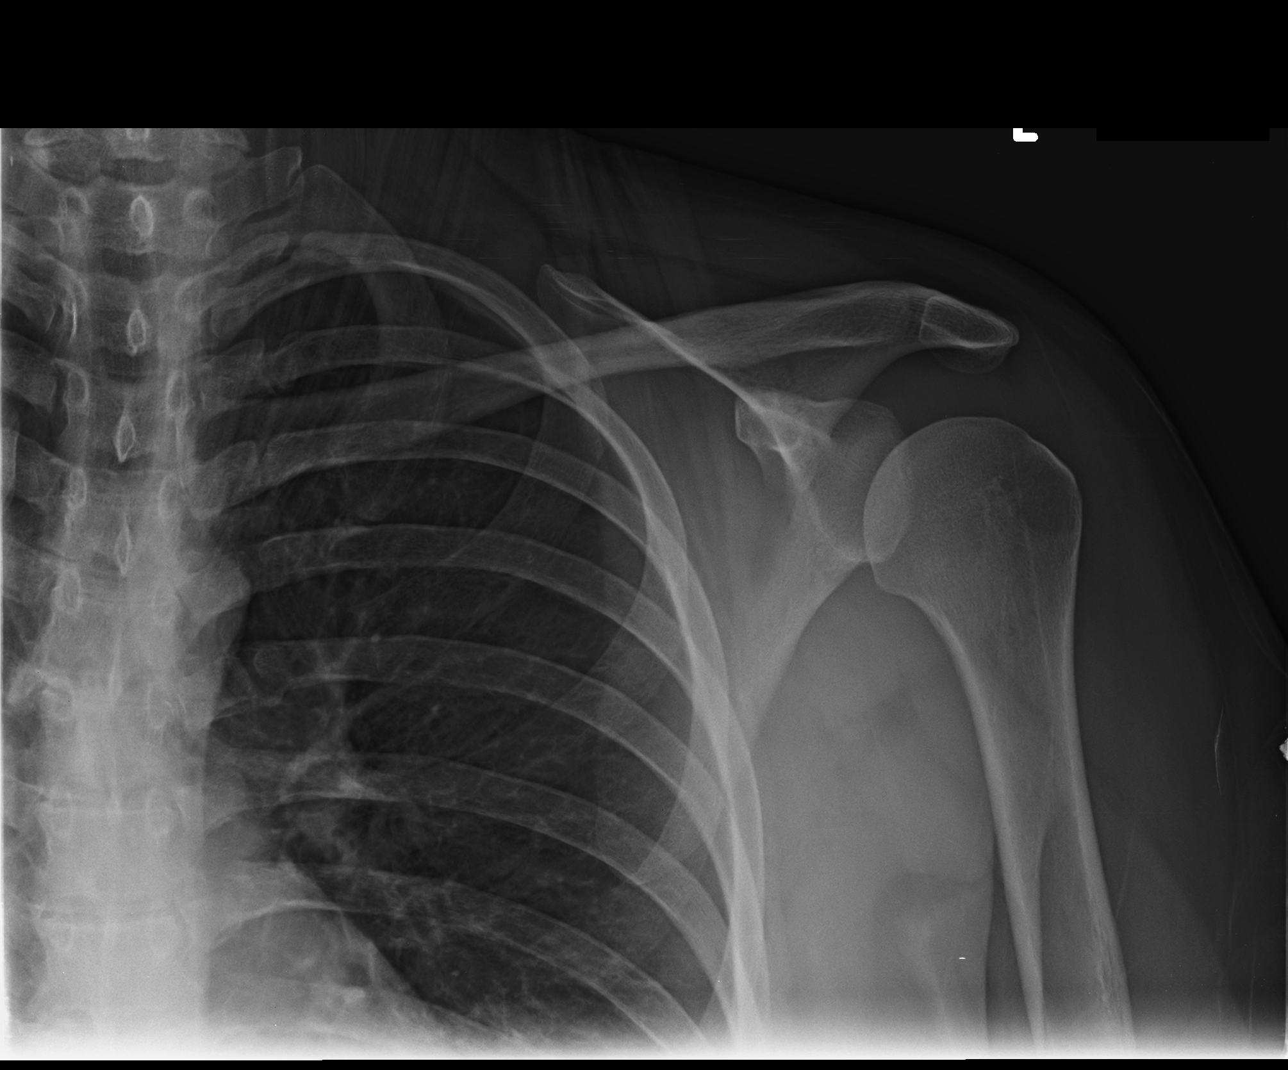

[shoulder internal]
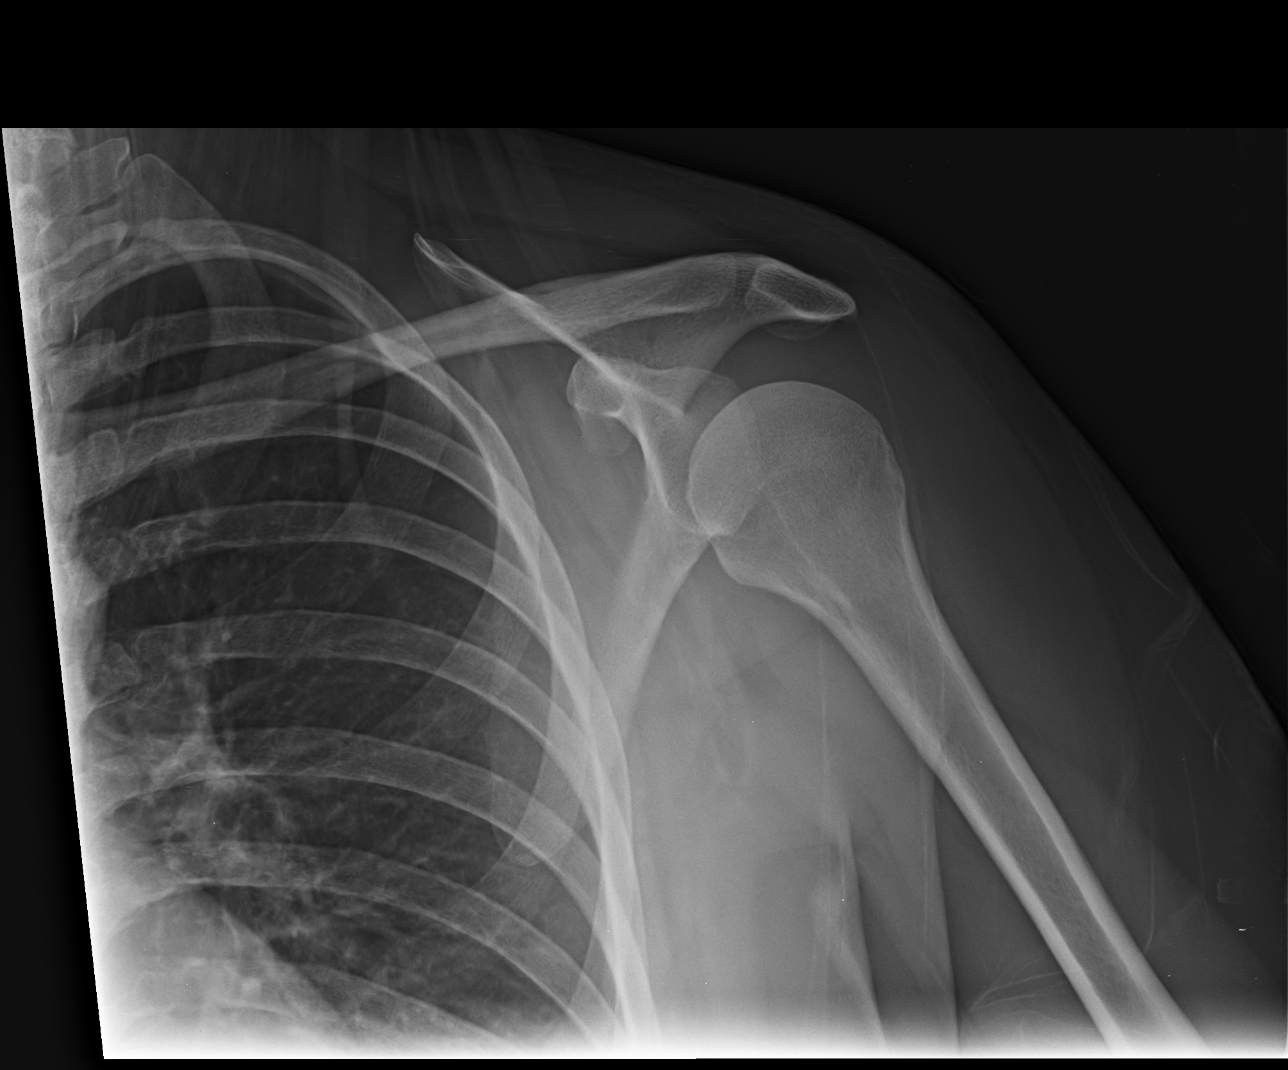

[shoulder y-view]
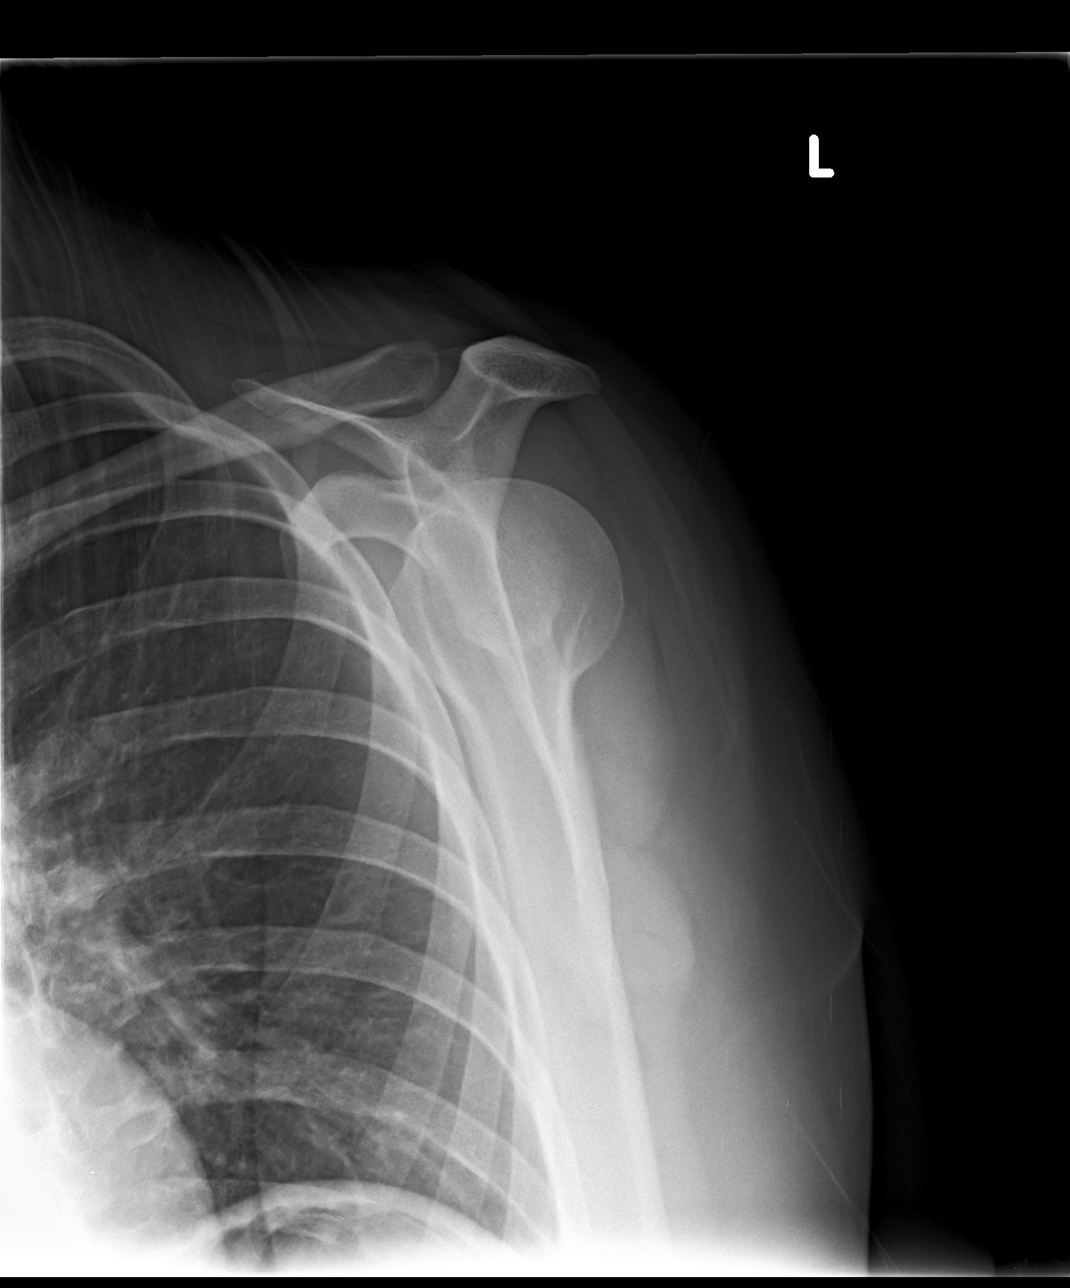

[3 of 3 positions shown; findings below may reference images not displayed]

FINDINGS: 3 views of the left shoulder to include scapular Y shows no acute fracture or dislocation.
The
subacromial joint space is preserved. There are no significant degenerative changes seen of the
acromioclavicular joint. Anatomic alignment is seen of the acromioclavicular joint without evidence
for
separation. I don't see any displaced upper rib fractures in the left upper half of the chest. No
significant
degenerative changes are seen of the glenohumeral joint. No osseous erosions are seen.
IMPRESSION: 1. No acute fracture or dislocation is seen of the shoulder.
2. No evidence is seen for acromioclavicular separation.

Tech Notes:

TWISTED ARM WHEN SHE FELL. LANDED WITH ARM CAUGHT BEHIND/UNDER HER. C/O LT SHOULDER PAIN THAT
RADIATES TO NECK. DENIES PREGNANCY. SHIELDED. HB

## 2019-06-26 IMAGING — CT ABDOMEN_PELVIS WO(Adult)
2 of 3 series · 11 of 46 positions shown, 12 images · non-contrast
Comparison: none

[Series 2: abdomen_pelvis ax 5.00 br40 s3 · axial · 0.59mm/px · z∈[+1200,+1630]mm · 8 of 98 slices shown, 9 images]
[im 7/98  soft-tissue]
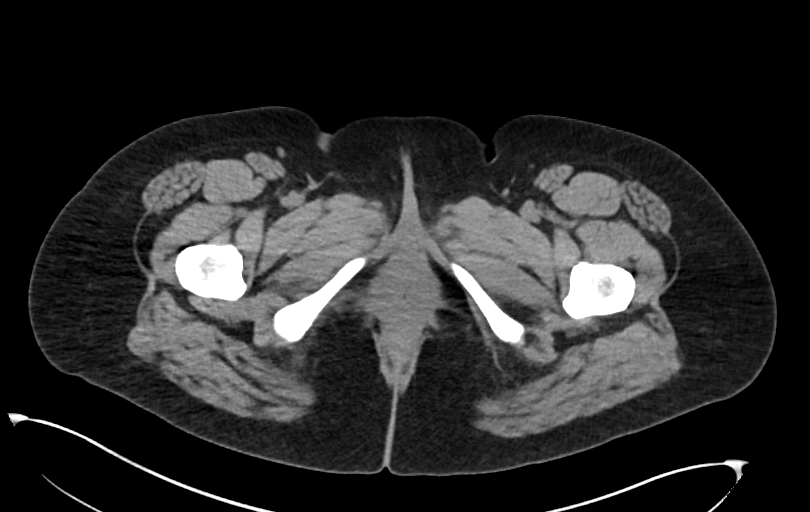
[im 7/98  bone]
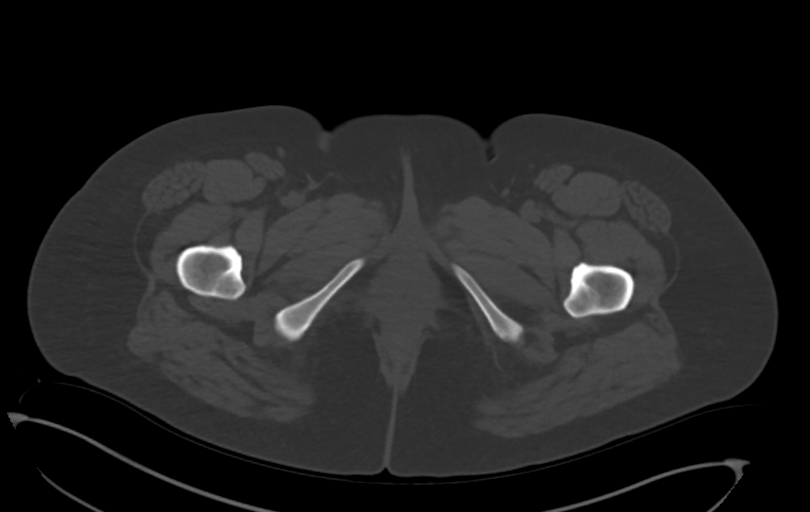
[im 19/98  soft-tissue]
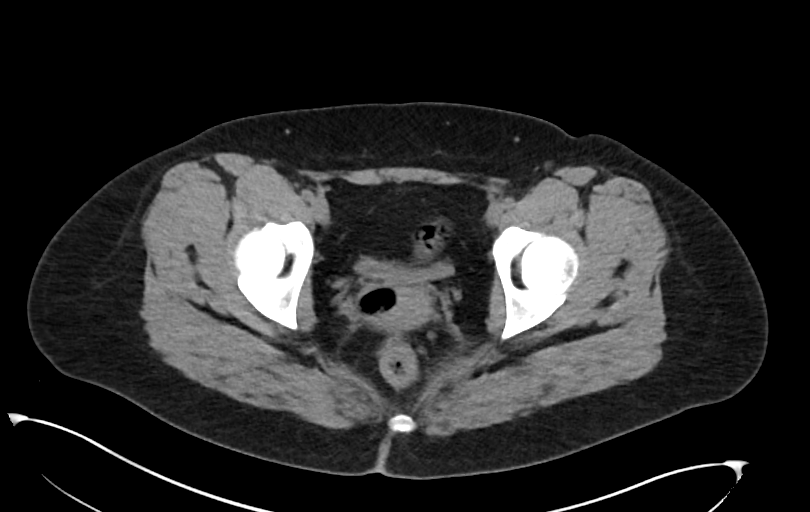
[im 32/98  soft-tissue]
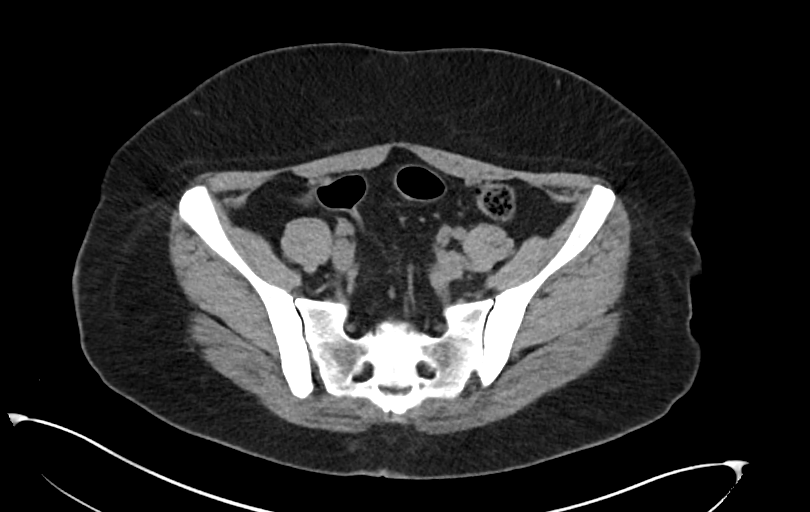
[im 44/98  soft-tissue]
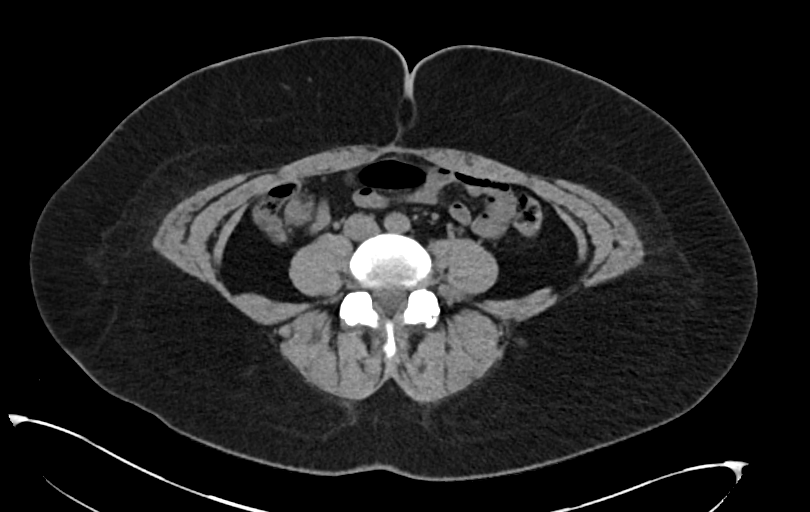
[im 54/98  soft-tissue]
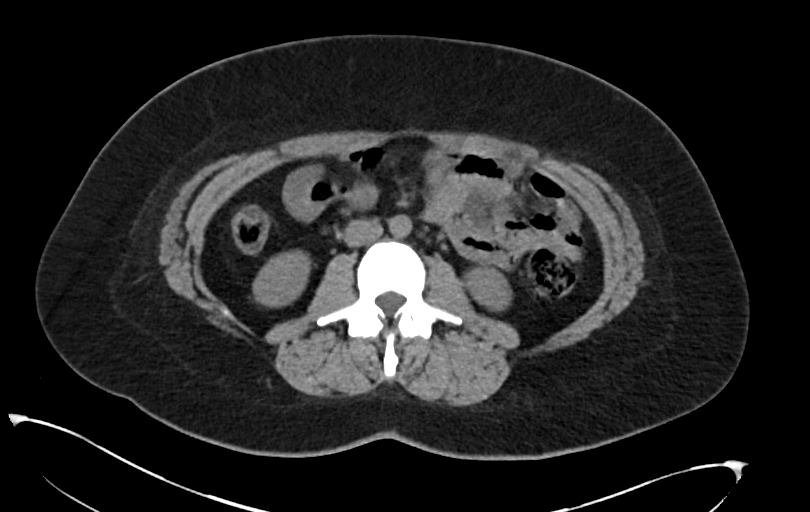
[im 66/98  soft-tissue]
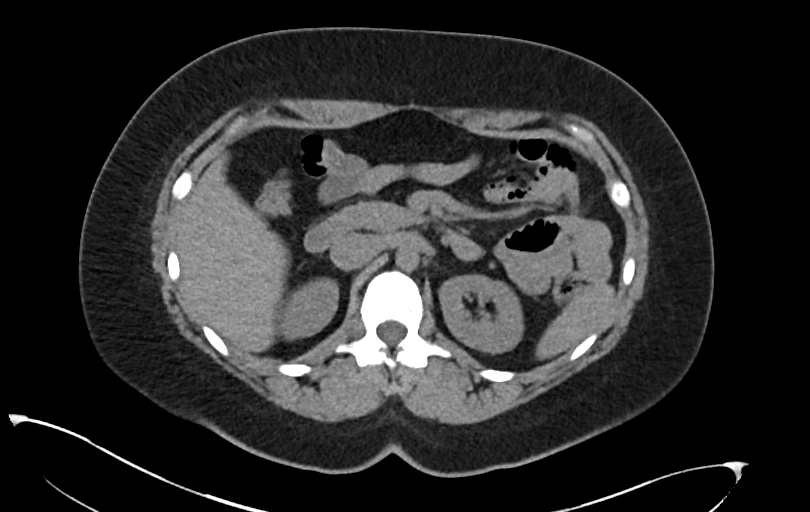
[im 79/98  soft-tissue]
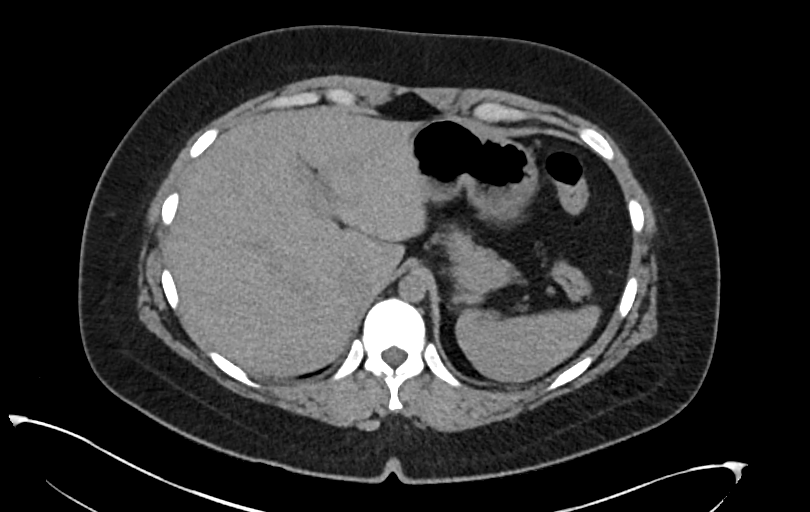
[im 91/98  soft-tissue]
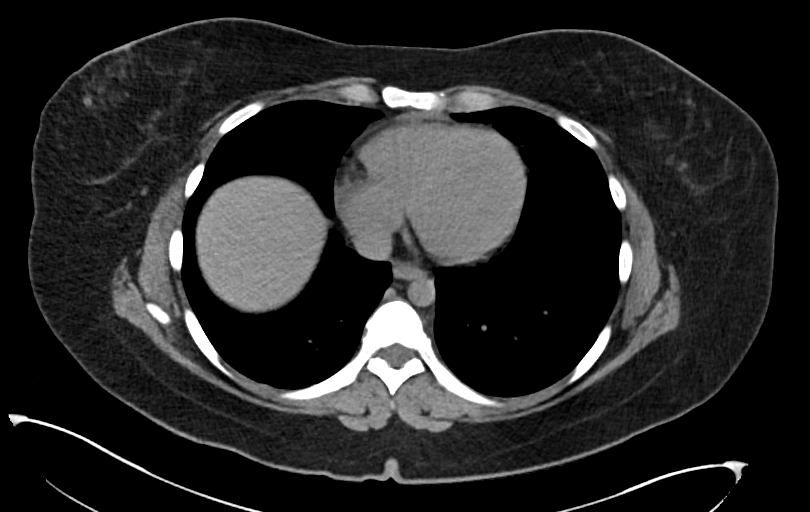

[Series 4: abdomen_pelvis cor 5.00 br40 s3 · coronal · 0.94mm/px · 3 of 59 slices shown]
[im 20/59  soft-tissue]
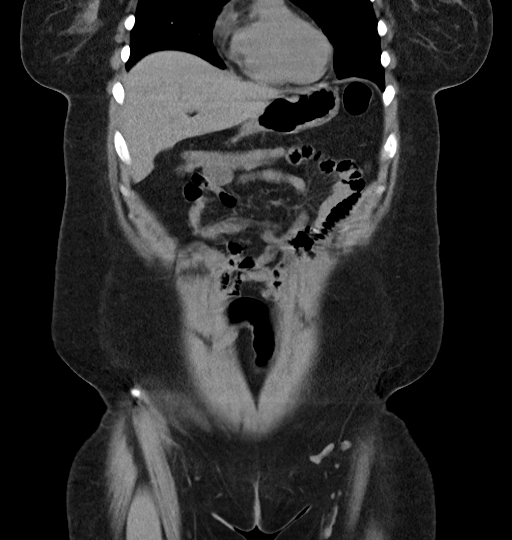
[im 26/59  soft-tissue]
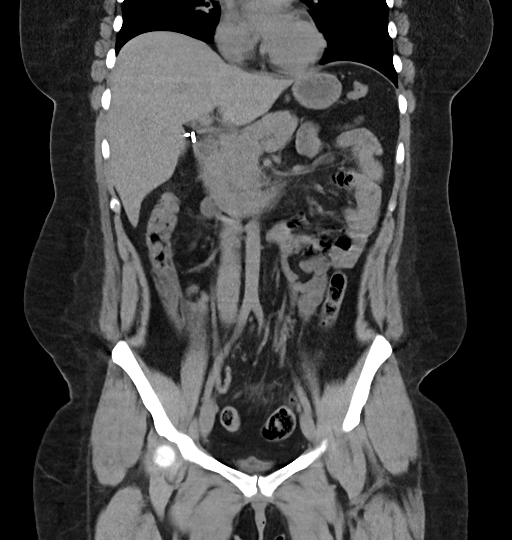
[im 33/59  soft-tissue]
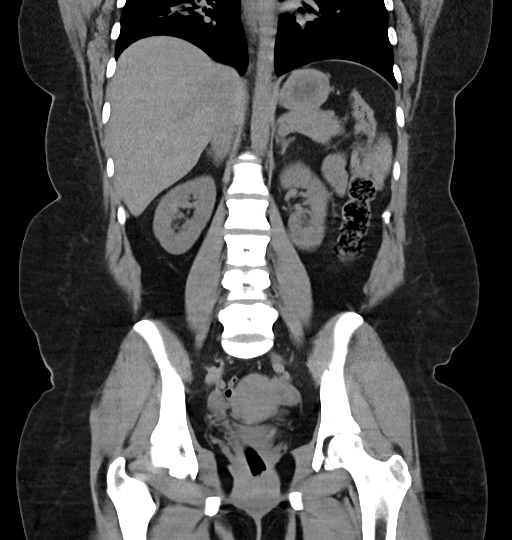

[11 of 46 positions shown; findings below may reference images not displayed]

DIAGNOSTIC STUDIES

EXAM

COMPUTED TOMOGRAPHY, ABDOMEN AND PELVIS; WITHOUT CONTRAST MATERIAL CPT 66761

INDICATION

lower abdominal pain
PT C/O LOWER PELVIC PAIN. LMP CURRENT. HX OF TUBAL. HCG NEGATIVE. CT/NM 0/0. TJ

TECHNIQUE

Contiguous axial tomographic images were obtained from the lung bases to the symphysis pubis
without contrast.

All CT scans at this facility use dose modulation, iterative reconstruction, and/or weight based
dosing when appropriate to reduce radiation dose to as low as reasonably achievable.

0 Ct and nuclear scans in the last year.

COMPARISONS

03/27/2017

FINDINGS

The lung bases are clear. The liver, spleen, pancreas and adrenal glands appear grossly
unremarkable. Cholecystectomy. No hydronephrosis. No renal calculi. No bowel obstruction or free
air. Normal appendix. No aortic aneurysm. Subcentimeter retroperitoneal nodes. Tiny umbilical hernia
containing fat. The urinary bladder is incompletely distended. Tampon in place. The uterus and
adnexa appear grossly unremarkable. No acute fracture.

IMPRESSION

1. No hydronephrosis or renal calculi.

2. Tampon in place. The uterus and adnexa appear grossly unremarkable.

Tech Notes:

PT C/O LOWER PELVIC PAIN. LMP CURRENT. HX OF TUBAL. HCG NEGATIVE. CT/NM 0/0. TJ

## 2019-07-26 IMAGING — CR CHEST
3 series · 3 of 3 positions shown · non-contrast
Comparison: none

[shoulder external]
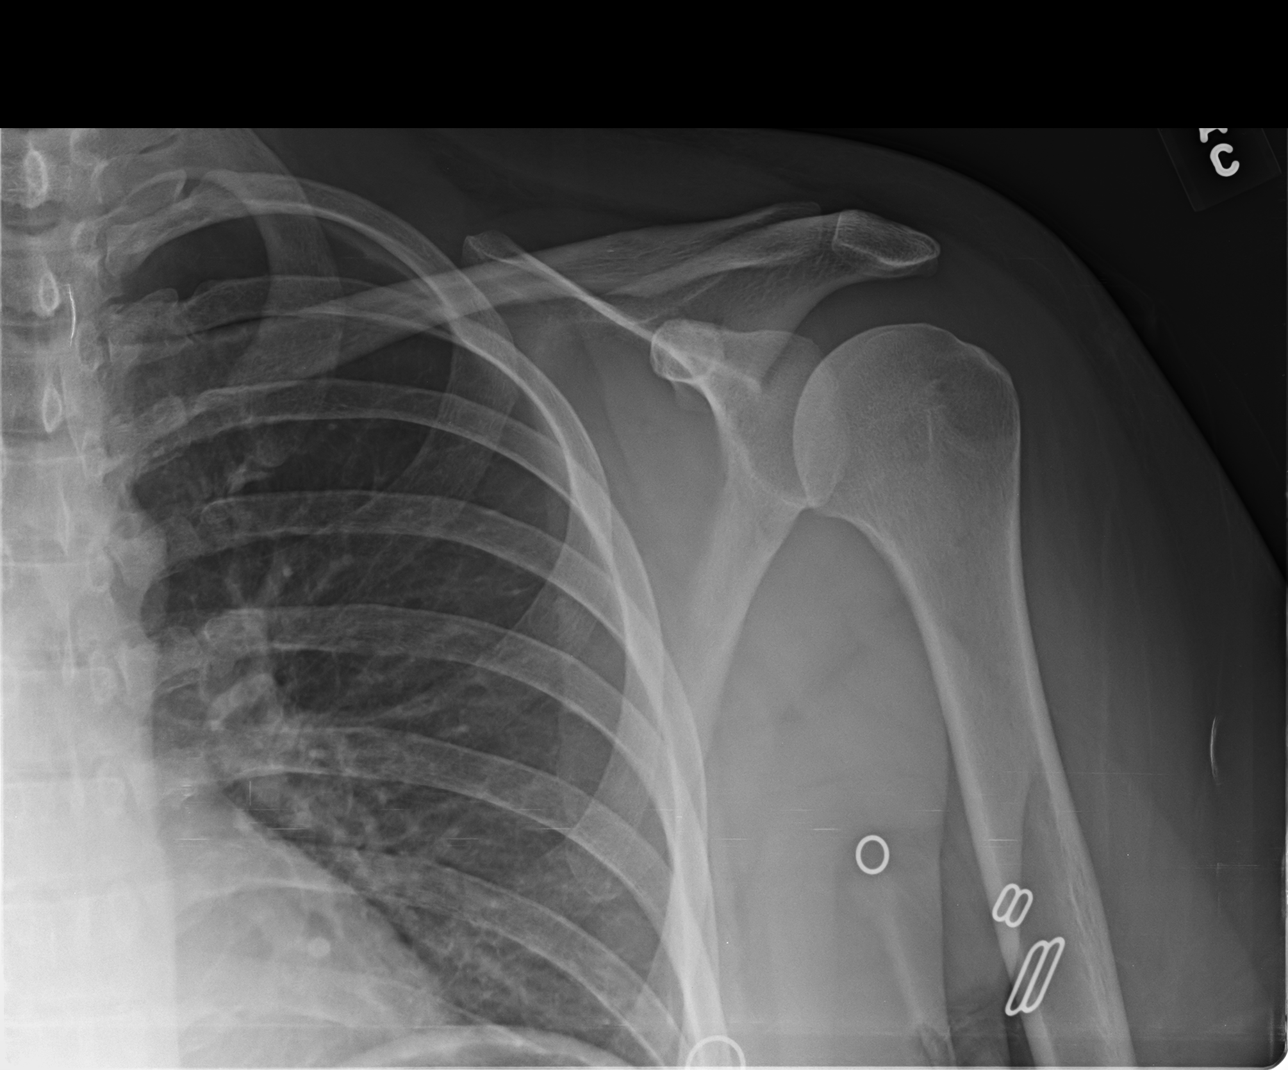

[shoulder internal]
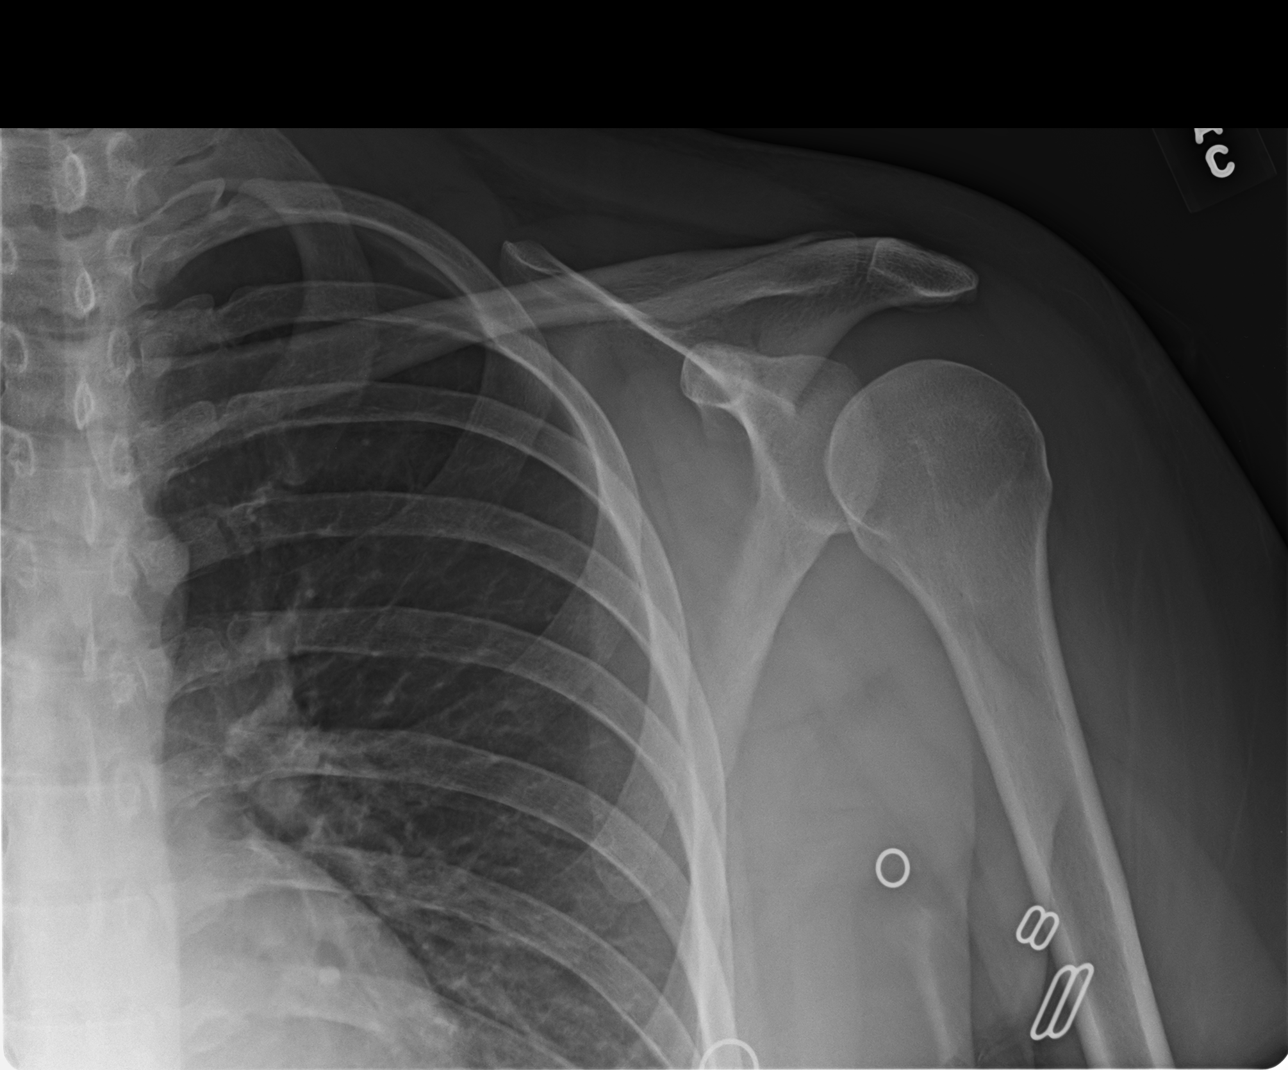

[shoulder y-view]
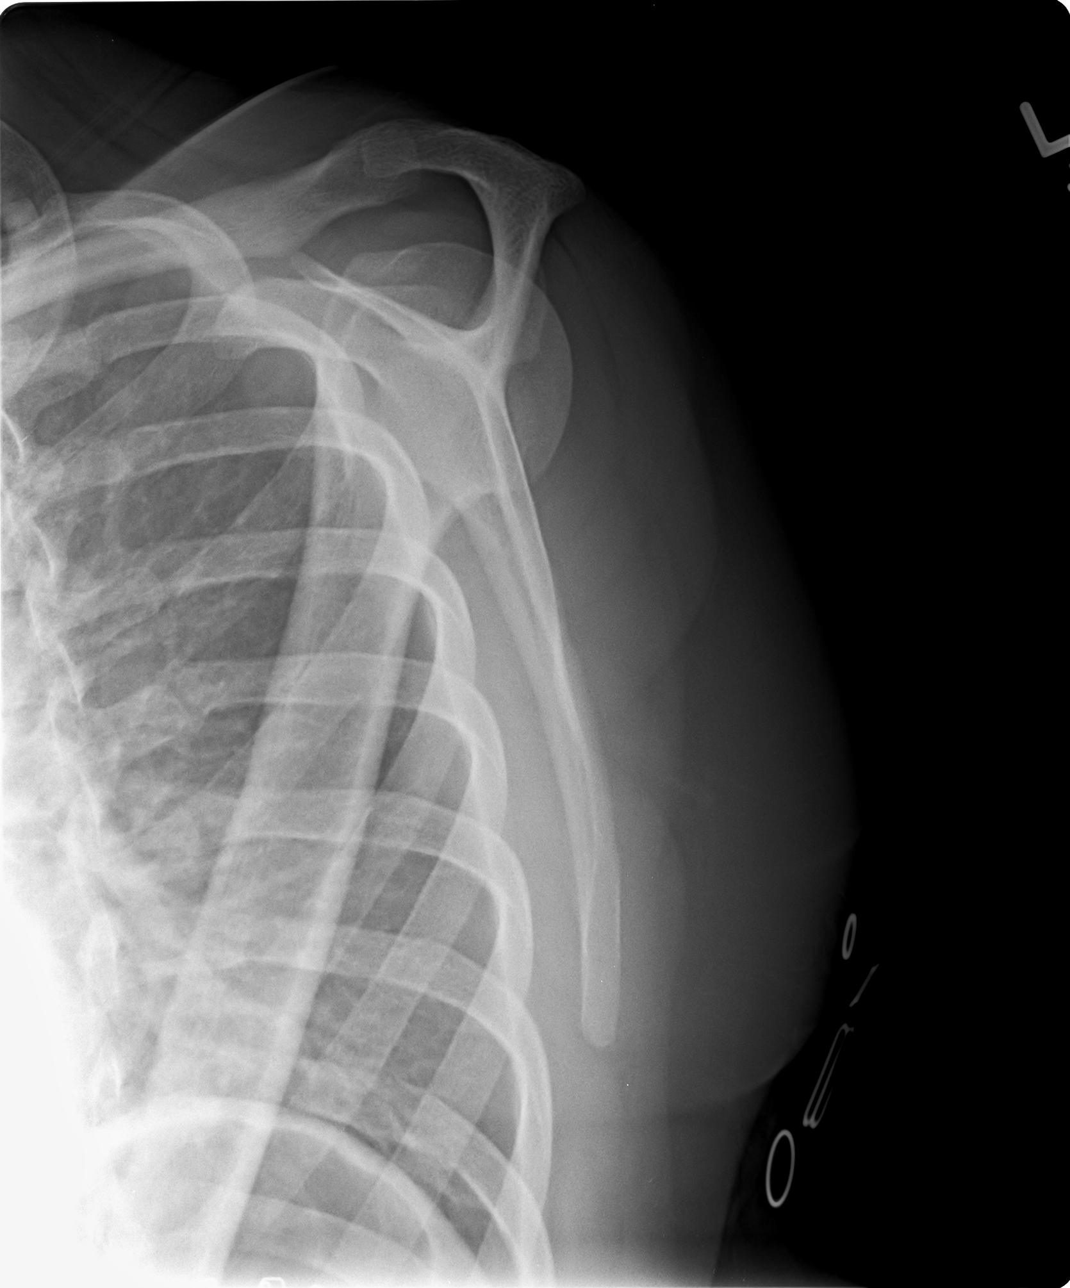

[3 of 3 positions shown; findings below may reference images not displayed]

EXAM

XR shoulder left, complete

INDICATION

Left shoulder injury

TECHNIQUE

Three views of left shoulder

COMPARISONS

09/07/2018

FINDINGS

No radiographic evidence of an acute fracture, osseous malalignment, or aggressive focal osseous
lesion. There is anatomic joint alignment. Pleural thickening in the left lung apex.

IMPRESSION
1. No radiographic evidence of an acute osseous abnormality or significant degenerative change.
Given clinical concern for internal derangement, consider further evaluation with MRI.

Tech Notes:

Pt states pain in LT shoulder x today. PT states lifting box and heard pop noise. PT states Limited
ROM. denied pregnancy. shielded. jc

## 2019-08-12 IMAGING — MR Shoulder^ROUTINE
4 of 7 series · 22 of 40 positions shown · non-contrast
Comparison: none

[Series 3: T2 fat-sat · axial · 4.0mm · 0.35mm/px · z∈[-25,+80]mm · 7 of 22 slices shown (1 of 2)]
[im 1/22]
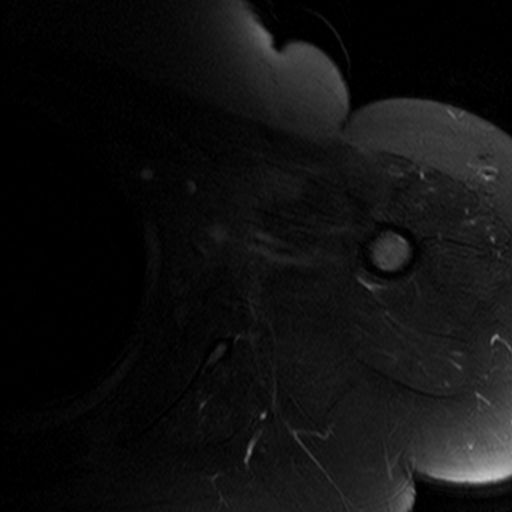
[im 4/22]
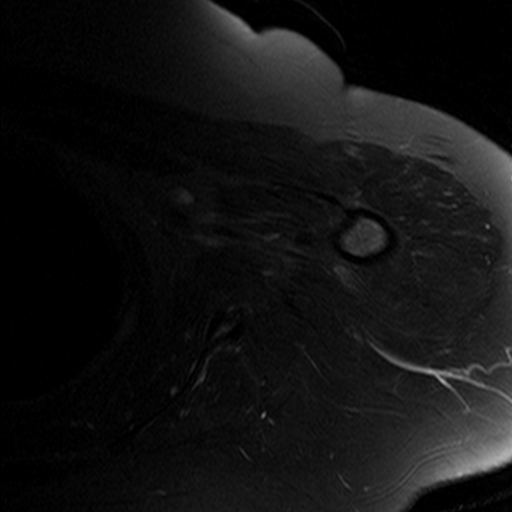
[im 8/22]
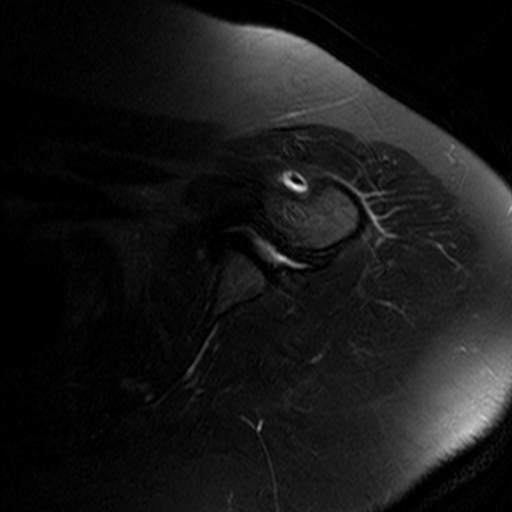
[im 11/22]
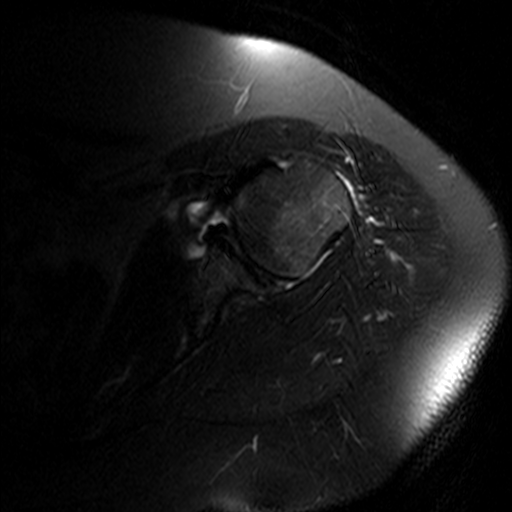
[im 15/22]
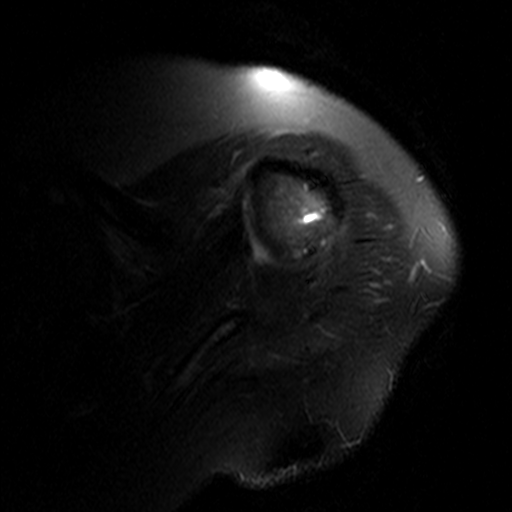
[im 18/22]
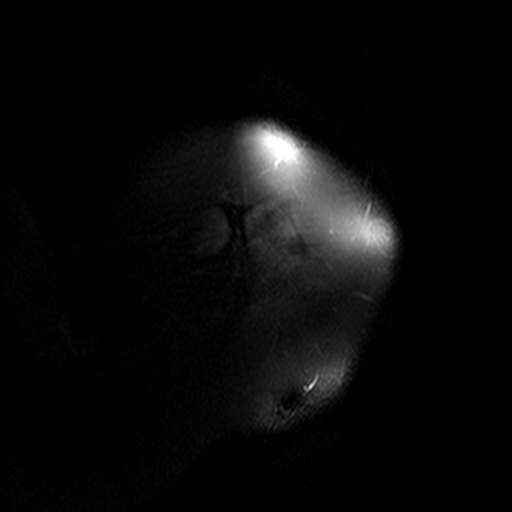
[im 22/22]
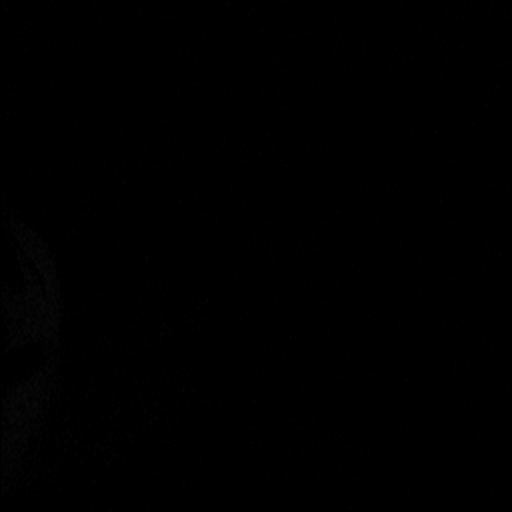

[Series 4: T2 fat-sat · oblique · 4.0mm · 0.27mm/px · 5 of 18 slices shown (2 of 2)]
[im 1/18]
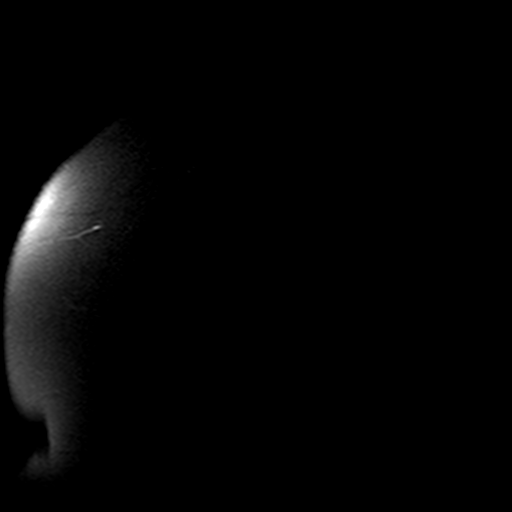
[im 5/18]
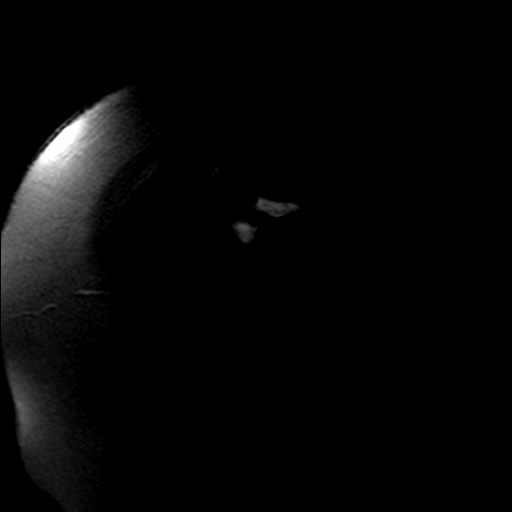
[im 9/18]
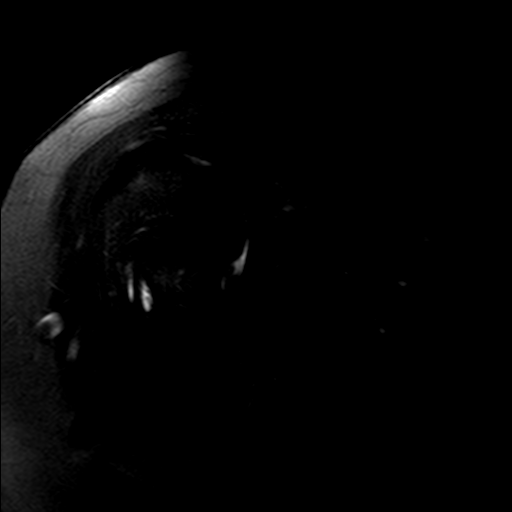
[im 13/18]
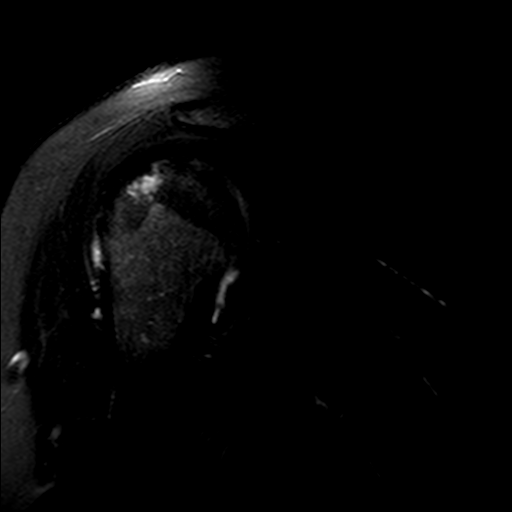
[im 18/18]
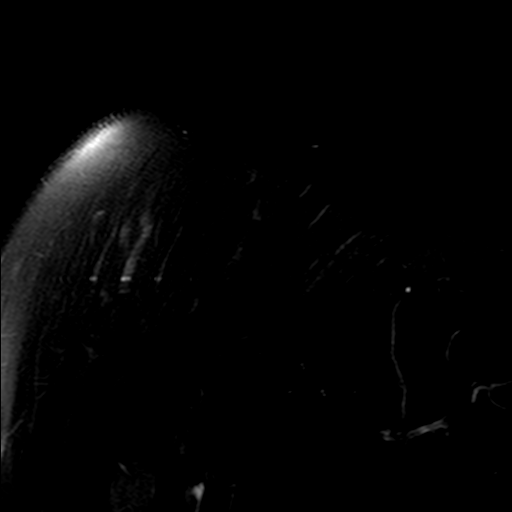

[Series 5: T1 · oblique · 4.0mm · 0.44mm/px · 6 of 22 slices shown]
[im 1/22]
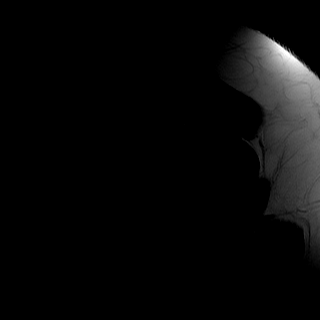
[im 5/22]
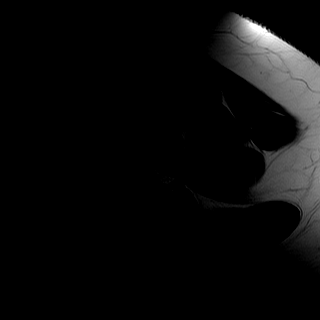
[im 9/22]
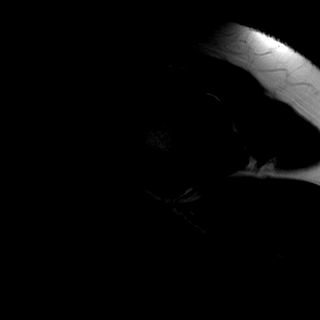
[im 13/22]
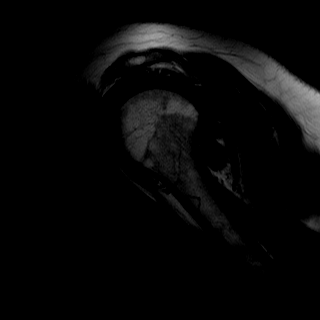
[im 17/22]
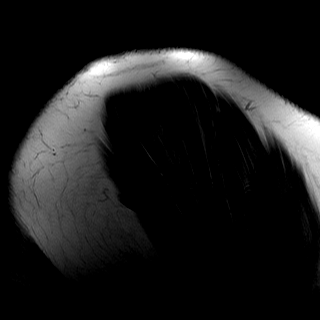
[im 22/22]
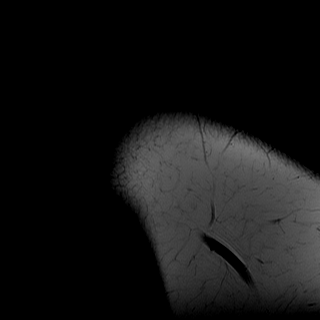

[Series 6: STIR · oblique · 4.0mm · 0.27mm/px · 4 of 21 slices shown]
[im 1/21]
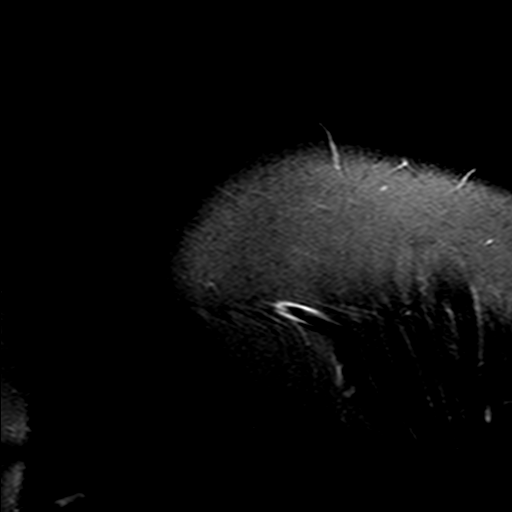
[im 5/21]
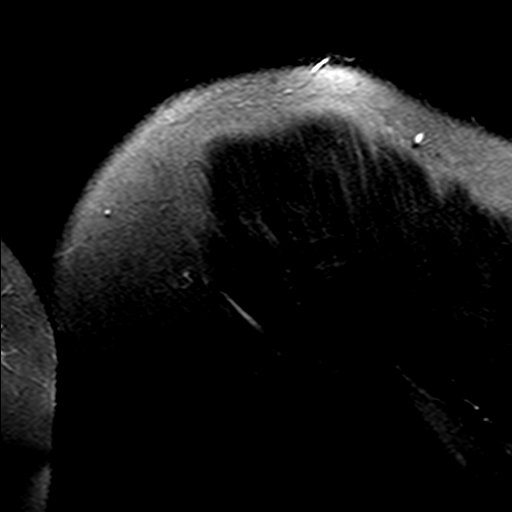
[im 13/21]
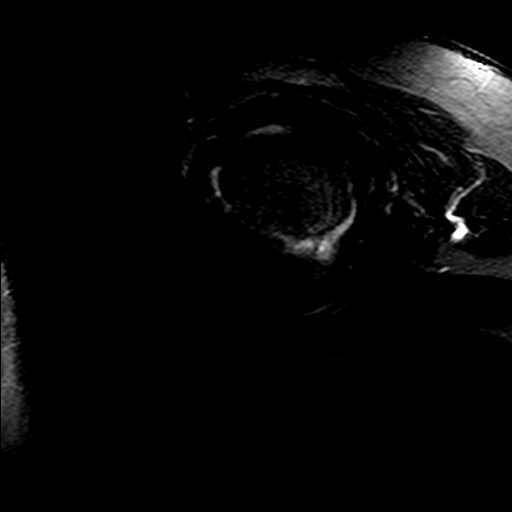
[im 21/21]
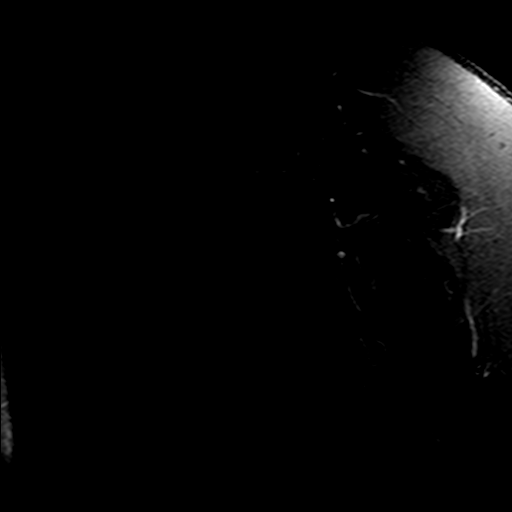

[22 of 40 positions shown; findings below may reference images not displayed]

DIAGNOSTIC STUDIES

EXAM

Left shoulder MRI.

INDICATION

left shoulder pain, weakness
LEFT SHOULDER PAIN AFTER LIFTING A HEAVY TOTE.  LIMITED ROM.

TECHNIQUE

Noncontrast MRI of the left shoulder.

COMPARISONS

Left shoulder radiographs July 26, 2019

FINDINGS

Acromioclavicular joint: No degenerative changes. No narrowing of the supraspinatus outlet. Type 1
acromion. The coracoacromial ligament is not thickened. No subacromial/subdeltoid bursitis.

Rotator cuff: The supraspinatus, infraspinatus, subscapularis, and teres minor tendons show no
evidence of significant tear or tendinopathy, though there is slight tendinopathy of the inferior
infraspinatus tendon.

Marrow/joint space: Cystic change of the posterior greater tuberosity. Small glenohumeral joint
effusion. The glenohumeral ligaments are intact without thickening or edema.

Biceps tendon: Well-seated in the intertubercular groove. No intracapsular tendinopathy or tear.

Labrum: There is evidence of tearing of the posterior labrum extending from the posterior superior
to the posterior inferior labrum. The anterior labrum is intact.

IMPRESSION

Posterior labral tearing is present.

Tech Notes:

LEFT SHOULDER PAIN AFTER LIFTING A HEAVY TOTE.  LIMITED ROM.

## 2021-04-28 IMAGING — MR Knee^Routine
6 of 7 series · 35 of 40 positions shown · non-contrast
Comparison: none

[Series 3: T2 fat-sat · axial · 4.0mm · 0.50mm/px · z∈[-67,+38]mm · 6 of 25 slices shown (1 of 3)]
[im 1/25]
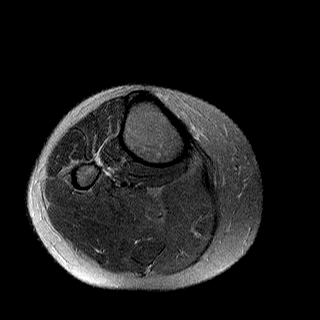
[im 5/25]
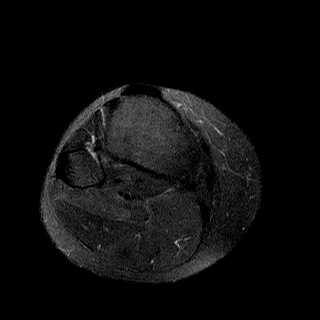
[im 10/25]
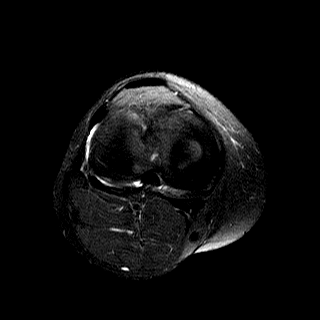
[im 15/25]
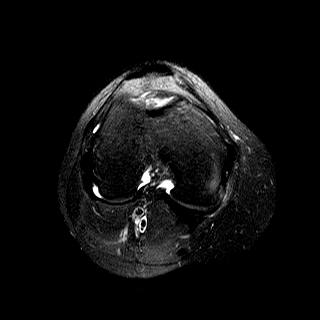
[im 20/25]
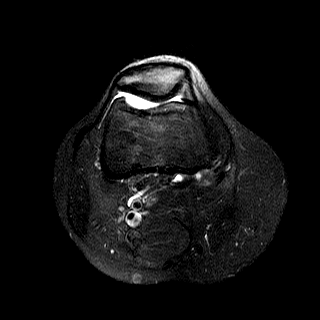
[im 25/25]
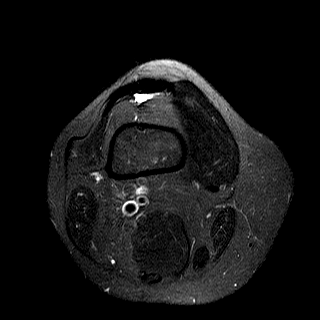

[Series 4: T1 · axial · 4.0mm · 0.62mm/px · z∈[-67,+38]mm · 6 of 25 slices shown]
[im 1/25]
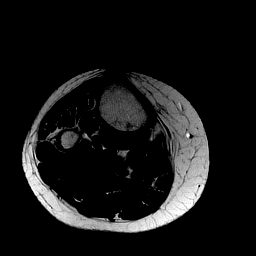
[im 5/25]
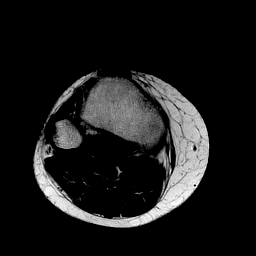
[im 10/25]
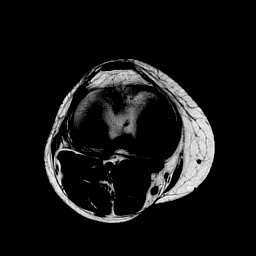
[im 15/25]
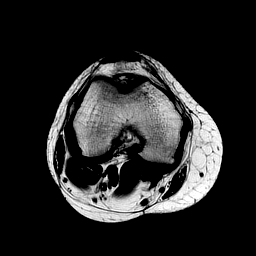
[im 20/25]
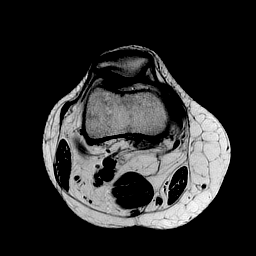
[im 25/25]
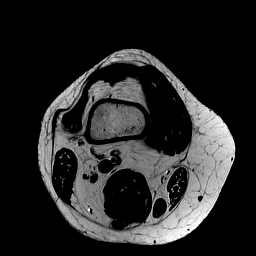

[Series 5: T2 fat-sat · coronal · 3.5mm · 0.50mm/px · 6 of 27 slices shown (2 of 3)]
[im 1/27]
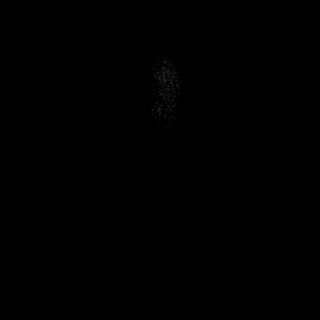
[im 6/27]
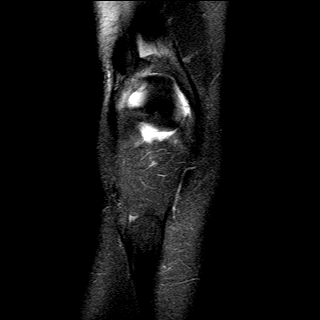
[im 11/27]
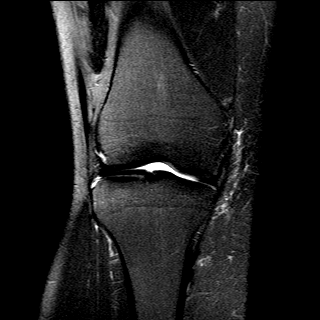
[im 16/27]
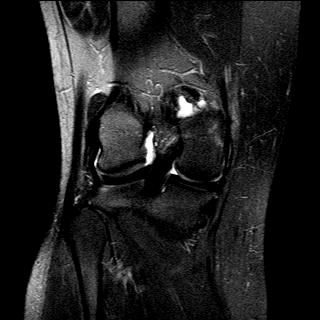
[im 21/27]
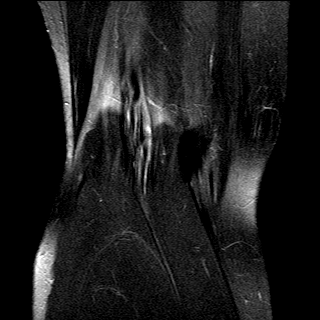
[im 27/27]
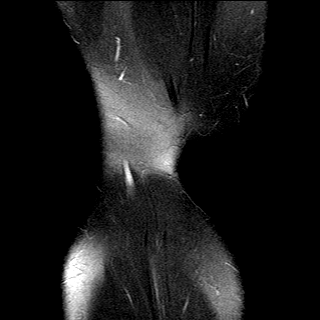

[Series 6: T2 fat-sat · sagittal · 4.0mm · 0.50mm/px · 6 of 25 slices shown (3 of 3)]
[im 1/25]
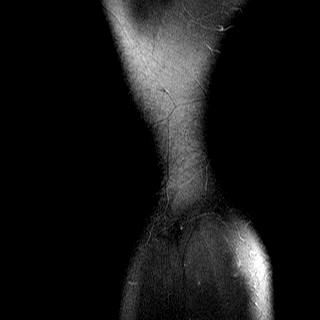
[im 5/25]
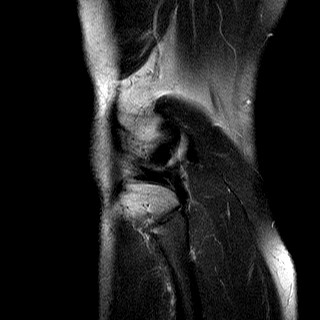
[im 10/25]
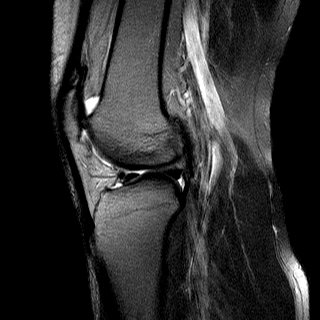
[im 15/25]
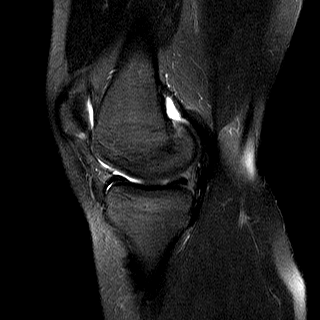
[im 20/25]
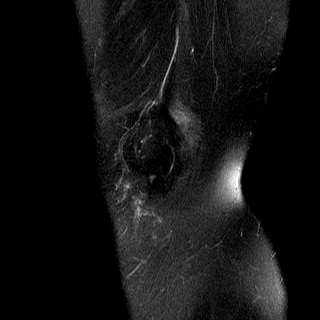
[im 25/25]
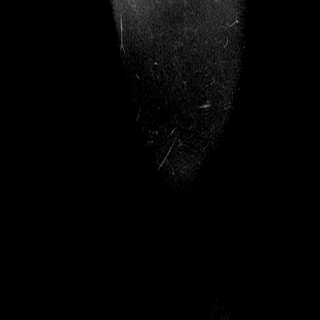

[Series 7: PD · sagittal · 4.0mm · 0.29mm/px · 6 of 25 slices shown]
[im 1/25]
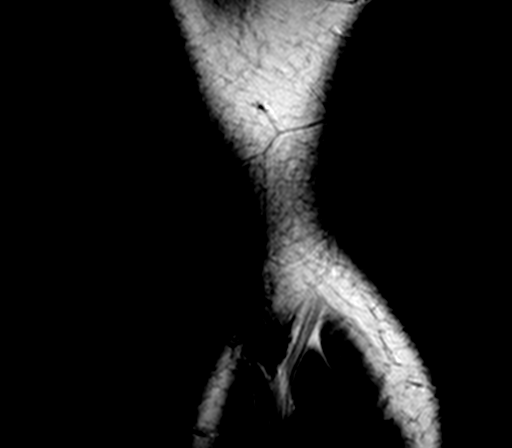
[im 5/25]
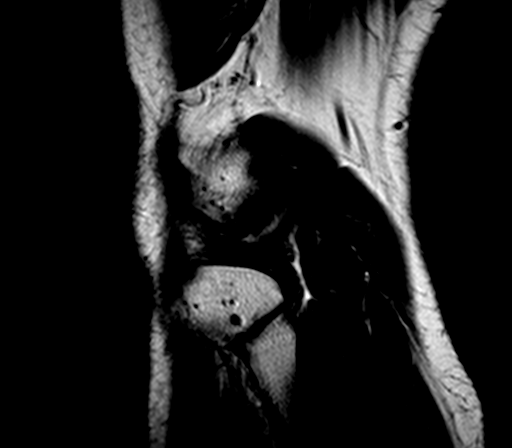
[im 10/25]
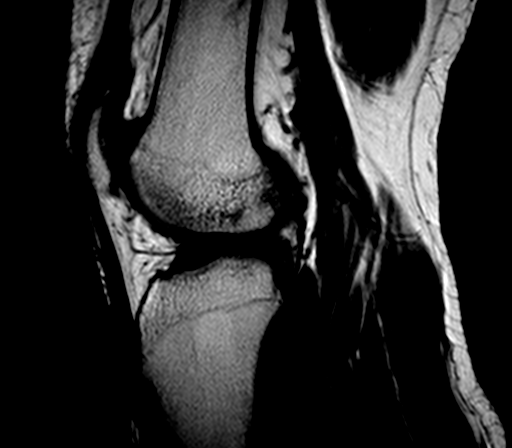
[im 15/25]
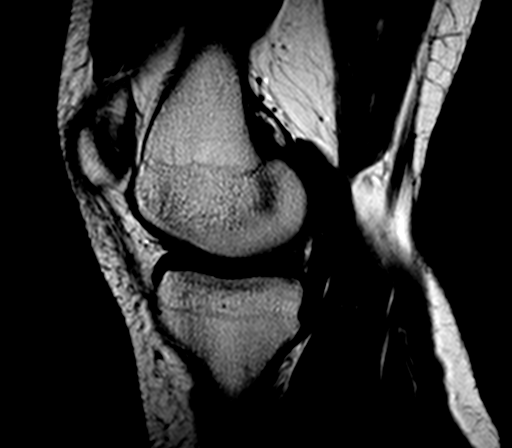
[im 20/25]
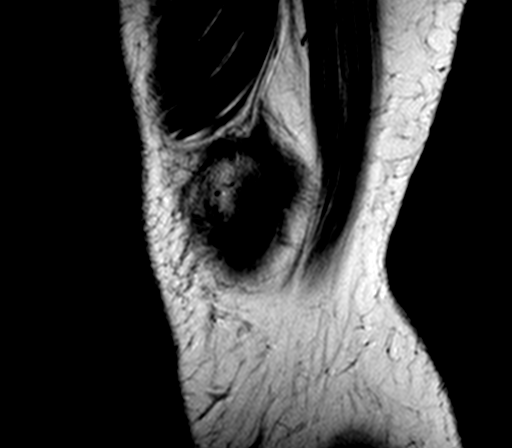
[im 25/25]
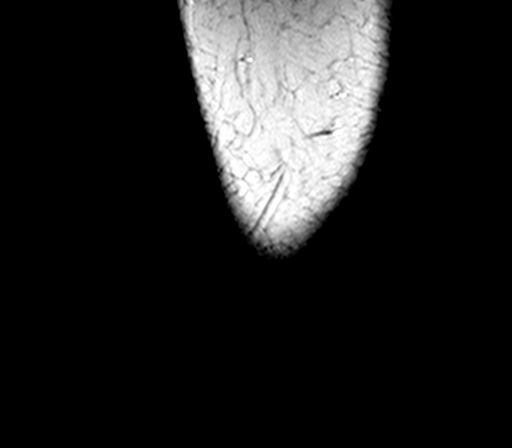

[Series 8: STIR · axial · 4.0mm · 0.29mm/px · z∈[-63,+34]mm · 5 of 23 slices shown]
[im 1/23]
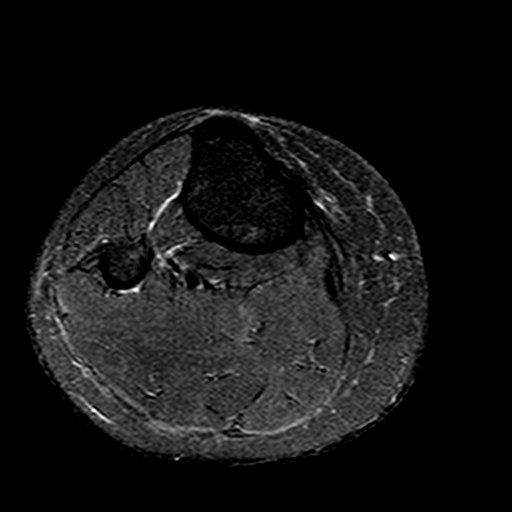
[im 6/23]
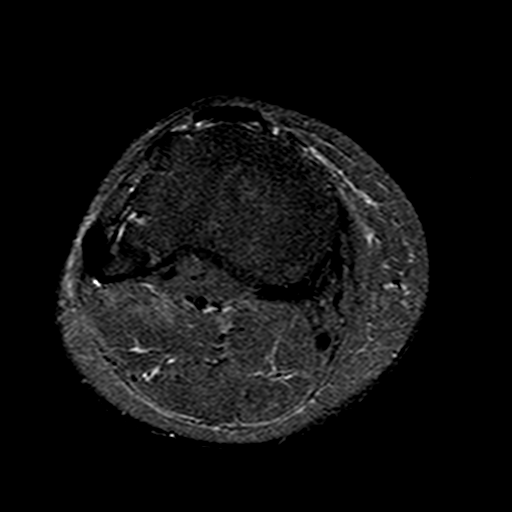
[im 12/23]
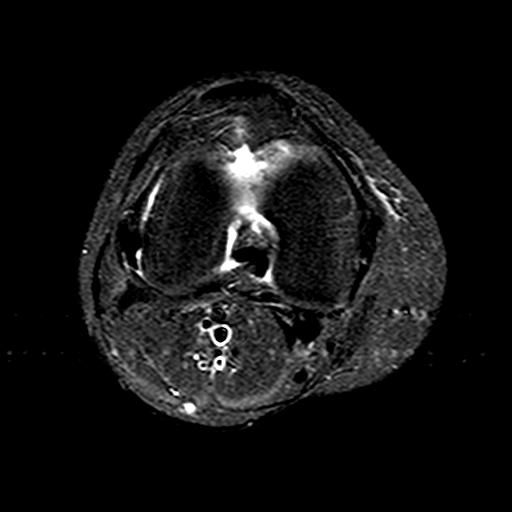
[im 17/23]
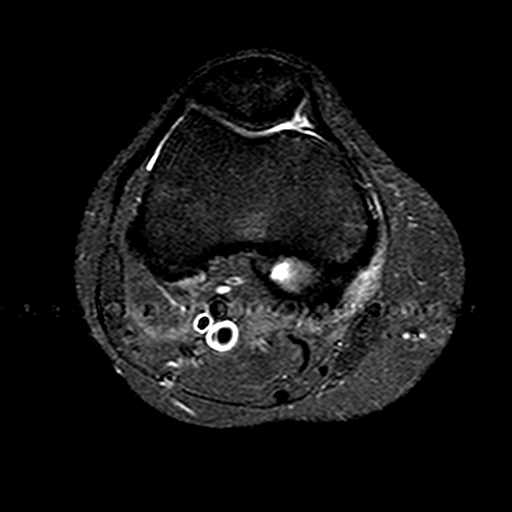
[im 23/23]
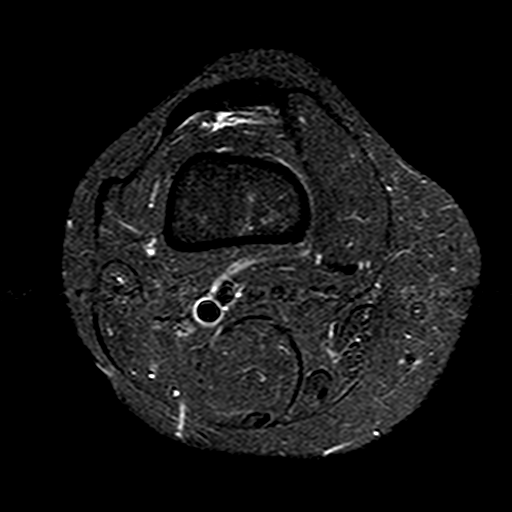

[35 of 40 positions shown; findings below may reference images not displayed]

DIAGNOSTIC STUDIES

EXAM

MRI of the right knee without contrast.

INDICATION

Right Knee Pain
RT KNEE PAIN, WORSE WITH WALKING AND BENDING.  RG

TECHNIQUE

Sagittal, axial, and coronal images were obtained with variable T1 and T2 weighting.

COMPARISONS

None available

FINDINGS

Quadriceps and patellar tendons are normal. The articular cartilage of the patellofemoral joint
space is well preserved. Trace knee effusion is evident.

The anterior cruciate ligament likely is chronically torn and scarred to the PCL. Posterior
cruciate ligament is within normal limits.

The medial and lateral collateral ligaments are within normal limits.

The menisci are unremarkable.

Mild edema is noted within the posterior medial femoral condyle which likely is degenerative or
represent small bone contusion.

IMPRESSION

Findings suggesting chronic tear of the ACL with subsequent scarring to the PCL. Correlation with
history of prior trauma or surgery is recommended.

Small knee effusion and minimal nonspecific marrow edema within the posterior medial femoral
condyle.

Tech Notes:

RT KNEE PAIN, WORSE WITH WALKING AND BENDING.  RG

## 2021-05-19 ENCOUNTER — Encounter: Admit: 2021-05-19 | Discharge: 2021-05-19

## 2021-05-19 IMAGING — CT ORBITS_FACIAL_WITH(Adult)
3 of 7 series · 15 of 47 positions shown, 18 images · non-contrast
Comparison: none

[Series 4: orbit/face cor 2.00 hr60 s3 · coronal · 0.33mm/px · 3 of 83 slices shown]
[im 21/83  bone]
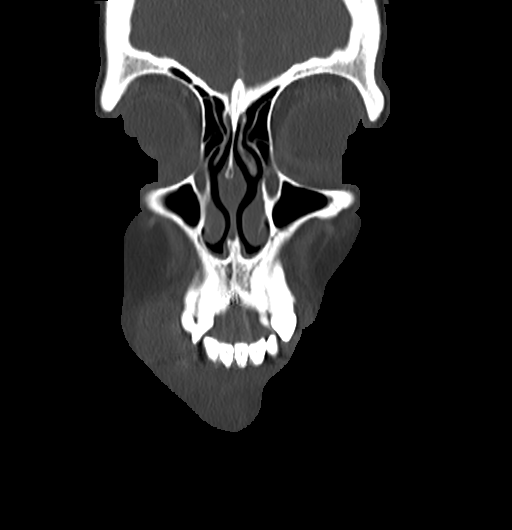
[im 42/83  bone]
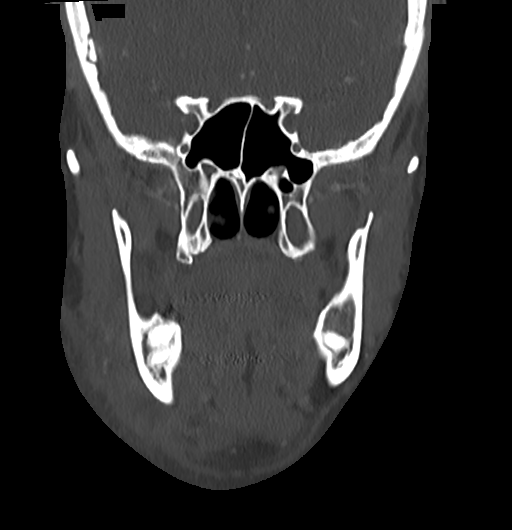
[im 62/83  bone]
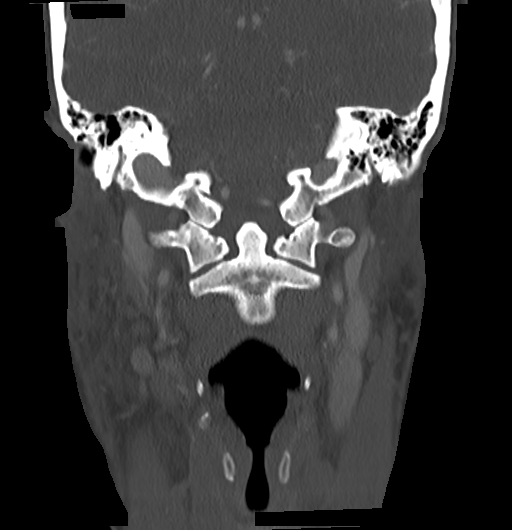

[Series 6: orbit/face sag 2.00 hr60 s3 · sagittal · 0.33mm/px · 2 of 85 slices shown]
[im 29/85  bone]
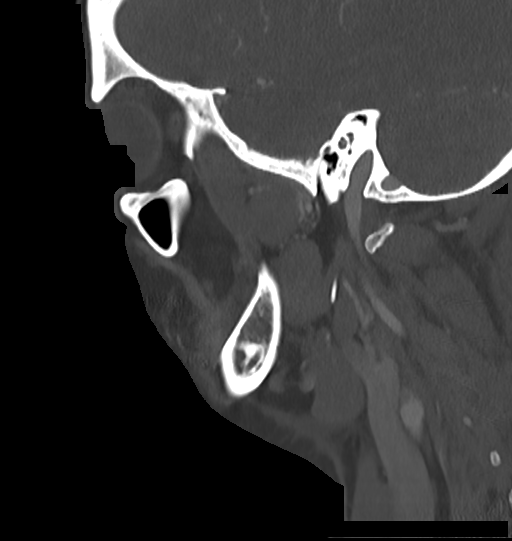
[im 57/85  bone]
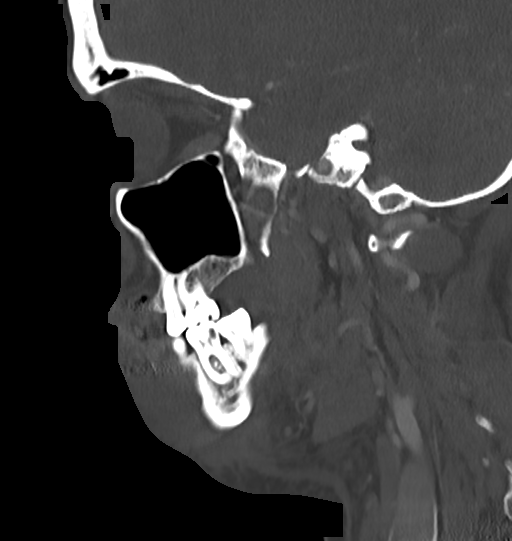

[Series 14: orbit/face 1.00 br40 s3 · axial · 0.34mm/px · z∈[-698,-541]mm · 10 of 349 slices shown, 13 images]
[im 19/349  brain]
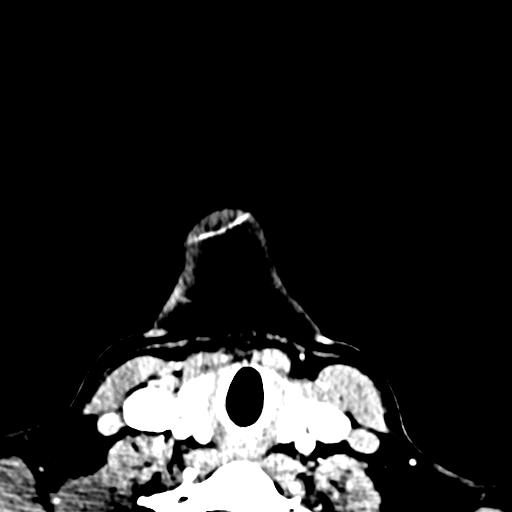
[im 19/349  bone]
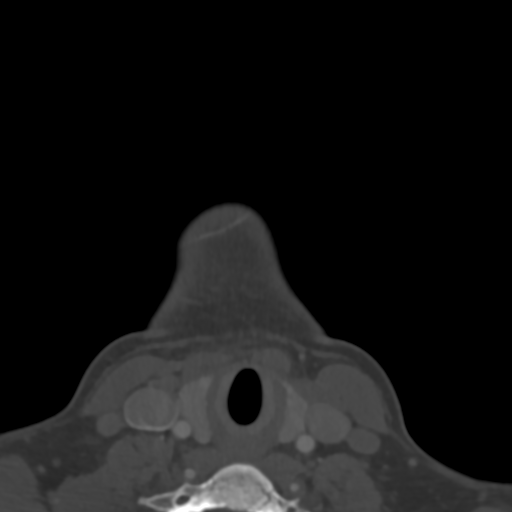
[im 55/349  bone]
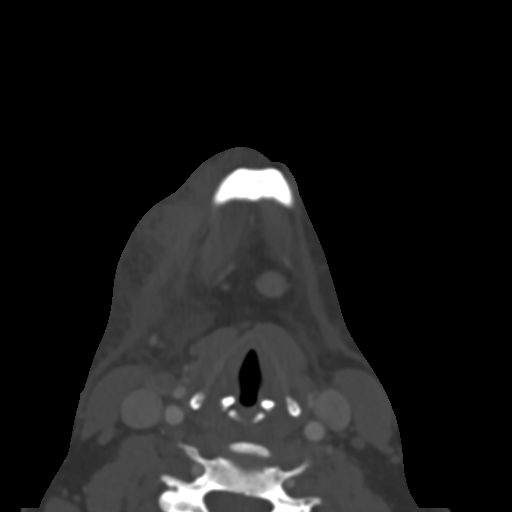
[im 92/349  bone]
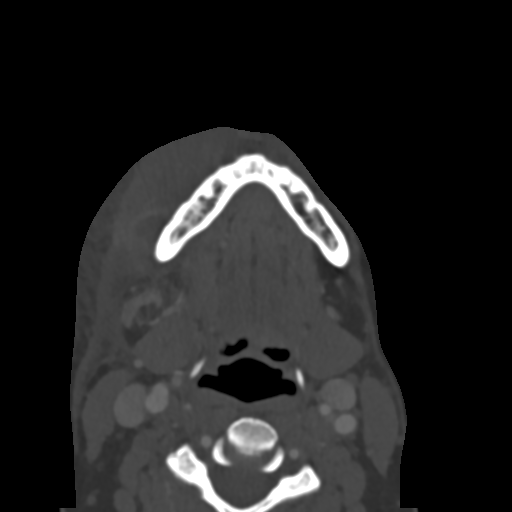
[im 129/349  bone]
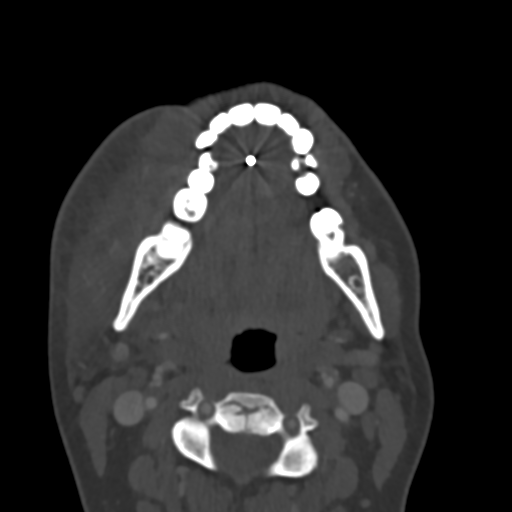
[im 165/349  brain]
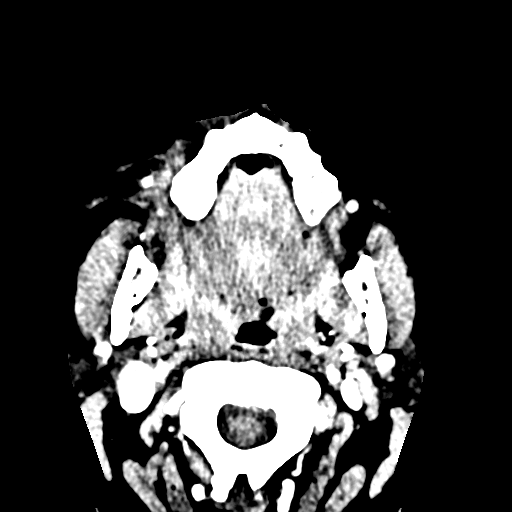
[im 165/349  bone]
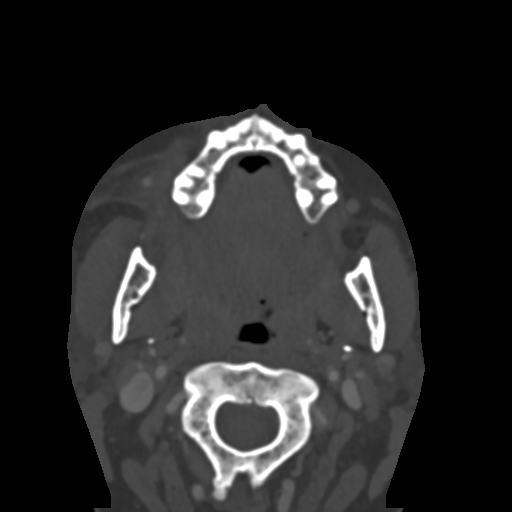
[im 184/349  bone]
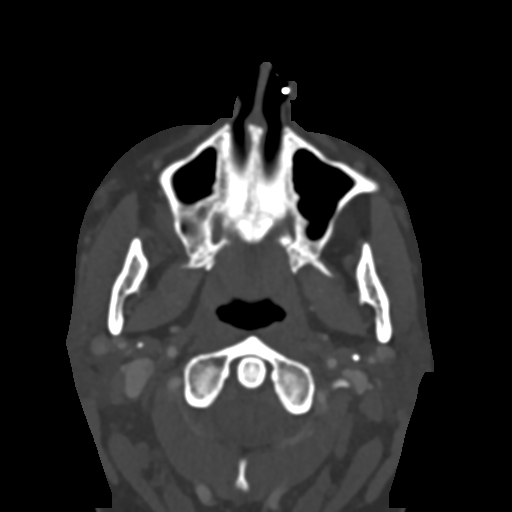
[im 220/349  bone]
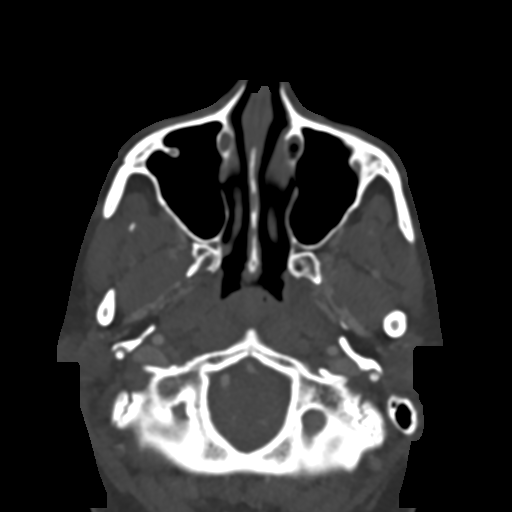
[im 257/349  bone]
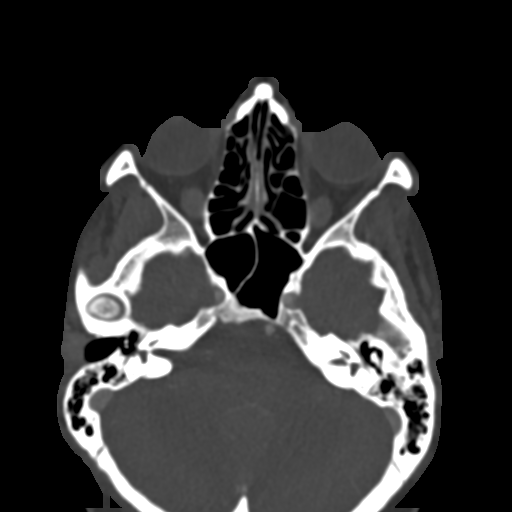
[im 294/349  brain]
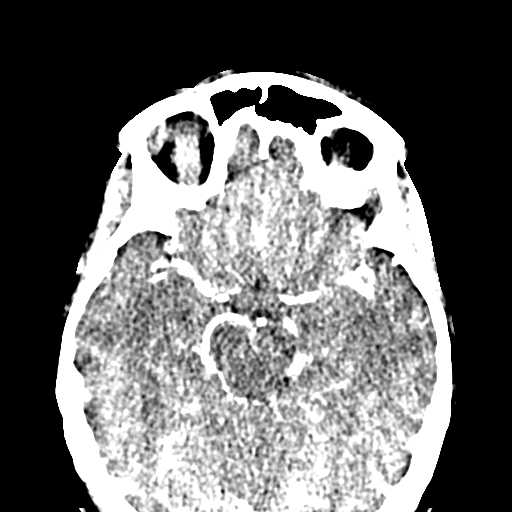
[im 294/349  bone]
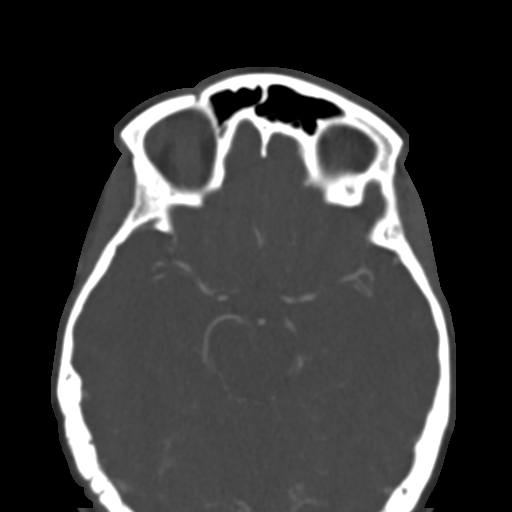
[im 330/349  bone]
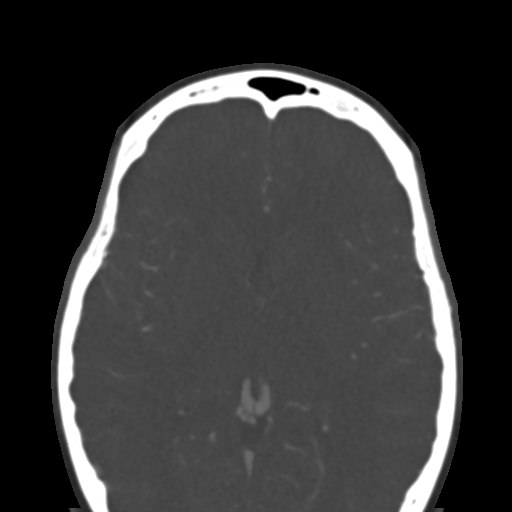

[15 of 47 positions shown; findings below may reference images not displayed]

DIAGNOSTIC STUDIES

EXAM

CT facial bones with contrast

INDICATION

R mandibluar abscess vs submandibular duct occlusi
PT STATES WOKE UP WITH RIGHT SUBMANDIBULAR SWELLING. TOOTH PAIN. MCCWL D9ETHII. CT/NM 0/0. TJ

TECHNIQUE

All CT scans at this facility use dose modulation, iterative reconstruction, and/or weight based
dosing when appropriate to reduce radiation dose to as low as reasonably achievable.

Number of previous computed tomography exams in the last 12 months is 0  .

Number of previous nuclear medicine myocardial perfusion studies in the last 12 months is 0  .

COMPARISONS

None available

FINDINGS

Extensive soft tissue edema and subcutaneous stranding is seen overlying the right lower face and
jaw. Laboratory changes extend along the deep fascial planes as well as to a minimal degree across
of midline. There is a low-density rim enhancing fluid collection overlying the lower mandibular
ramus measuring 1.7 cm AP 0.8 cm transverse and 2.7 cm craniocaudad consistent with abscess. The
adjacent mandible demonstrates small area of cortical disruption laterally image 30 series 2
secondary to right lower molar or pre molar root. There is also small area of possible cortical
breakthrough image 26 series 8. Extensive dental caries can be seen involving the right lower molar
sent to lesser degree the remaining teeth. Metallic density within the oral cavity is seen
consistent with piercing.

IMPRESSION

Extensive inflammatory changes of the right lower face and jaw with small rim enhancing fluid
collection along the right mandibular ramus consistent with abscess. This likely is odontogenic in
origin given areas of possible early cortical destruction involving the outer cortex of the right
mandibular ramus and extensive underlying dental caries. Report called Dr. Nav at [DATE] p.m..

Tech Notes:

PT STATES WOKE UP WITH RIGHT SUBMANDIBULAR SWELLING. TOOTH PAIN. MCCWL D9ETHII. CT/NM 0/0. TJ

## 2021-05-19 NOTE — Progress Notes
Dx: infection around tooth in early sept. Abx started at that time. Woke up this morning with swelling under right mandible "tennis ball sized".     VS: BP 140/72 HR 98 RR 18 t 98.0 SpO2 100% on RA    Meds: 600mg  clindamycin IV, fent x2, IV KCl    Labs: WBC 14 K 2.9 otherwise normal    Neuro intact     PMH: cholecystostomy     CT head: Extensive cellulitis with1.5x2cm abscess near mandible. suggestive for osteomyelitis     Reason for transfer: Service not available: Needs ENT or Oral surgeon.

## 2022-05-07 ENCOUNTER — Encounter: Admit: 2022-05-07 | Discharge: 2022-05-07

## 2022-05-07 NOTE — Progress Notes
36 yr old left upper jaw pain and swelling. Seen 08/29 for left tooth pain. Completed course of Clindamycin and Augmentin. Swelling returned last night. Has appt 10/13 with dentist for tooth extraction.  No airway concerns. Handling secretions. Anxious. Also has lesser left lower tooth pain.    Seen last year for same. Seen at [facility]. Sent to dentist.     Imaging:  CT- Left facial soft tissue swelling. No fluid collection or emphysema and no abscess seen  Meds:  Zosyn. Toradol.    Vitals:  117/75 103  99.1  103  98% AOX4
